# Patient Record
Sex: Female | Born: 1965 | Race: Black or African American | Hispanic: No | Marital: Single | Smoking: Never smoker
Health system: Southern US, Community
[De-identification: ages and names within clinical notes are randomized; demographics above are authoritative.]

## PROBLEM LIST (undated history)

## (undated) DIAGNOSIS — E559 Vitamin D deficiency, unspecified: Secondary | ICD-10-CM

## (undated) DIAGNOSIS — B351 Tinea unguium: Secondary | ICD-10-CM

## (undated) DIAGNOSIS — M1990 Unspecified osteoarthritis, unspecified site: Secondary | ICD-10-CM

## (undated) DIAGNOSIS — E785 Hyperlipidemia, unspecified: Secondary | ICD-10-CM

## (undated) DIAGNOSIS — E119 Type 2 diabetes mellitus without complications: Secondary | ICD-10-CM

## (undated) DIAGNOSIS — R002 Palpitations: Secondary | ICD-10-CM

## (undated) DIAGNOSIS — D219 Benign neoplasm of connective and other soft tissue, unspecified: Secondary | ICD-10-CM

## (undated) DIAGNOSIS — N189 Chronic kidney disease, unspecified: Secondary | ICD-10-CM

## (undated) DIAGNOSIS — M549 Dorsalgia, unspecified: Secondary | ICD-10-CM

## (undated) DIAGNOSIS — D649 Anemia, unspecified: Secondary | ICD-10-CM

## (undated) DIAGNOSIS — I10 Essential (primary) hypertension: Secondary | ICD-10-CM

## (undated) HISTORY — DX: Dorsalgia, unspecified: M54.9

## (undated) HISTORY — DX: Vitamin D deficiency, unspecified: E55.9

## (undated) HISTORY — DX: Hyperlipidemia, unspecified: E78.5

## (undated) HISTORY — DX: Type 2 diabetes mellitus without complications: E11.9

## (undated) HISTORY — DX: Unspecified osteoarthritis, unspecified site: M19.90

## (undated) HISTORY — PX: ABDOMINAL HYSTERECTOMY: SHX81

## (undated) HISTORY — DX: Palpitations: R00.2

## (undated) HISTORY — DX: Chronic kidney disease, unspecified: N18.9

## (undated) HISTORY — PX: SALPINGECTOMY: SHX328

## (undated) HISTORY — DX: Anemia, unspecified: D64.9

## (undated) HISTORY — DX: Tinea unguium: B35.1

## (undated) HISTORY — PX: ECTOPIC PREGNANCY SURGERY: SHX613

---

## 1998-10-03 ENCOUNTER — Other Ambulatory Visit: Admission: RE | Admit: 1998-10-03 | Discharge: 1998-10-03 | Payer: Self-pay | Admitting: Family Medicine

## 1998-10-25 ENCOUNTER — Emergency Department (HOSPITAL_COMMUNITY): Admission: EM | Admit: 1998-10-25 | Discharge: 1998-10-25 | Payer: Self-pay | Admitting: Emergency Medicine

## 1998-10-25 ENCOUNTER — Encounter: Payer: Self-pay | Admitting: Emergency Medicine

## 2001-07-22 ENCOUNTER — Encounter: Payer: Self-pay | Admitting: *Deleted

## 2001-07-22 ENCOUNTER — Encounter (INDEPENDENT_AMBULATORY_CARE_PROVIDER_SITE_OTHER): Payer: Self-pay

## 2001-07-22 ENCOUNTER — Inpatient Hospital Stay (HOSPITAL_COMMUNITY): Admission: AD | Admit: 2001-07-22 | Discharge: 2001-07-25 | Payer: Self-pay | Admitting: *Deleted

## 2001-08-16 ENCOUNTER — Other Ambulatory Visit: Admission: RE | Admit: 2001-08-16 | Discharge: 2001-08-16 | Payer: Self-pay | Admitting: Obstetrics and Gynecology

## 2004-02-24 ENCOUNTER — Emergency Department (HOSPITAL_COMMUNITY): Admission: EM | Admit: 2004-02-24 | Discharge: 2004-02-24 | Payer: Self-pay | Admitting: Emergency Medicine

## 2005-05-12 ENCOUNTER — Ambulatory Visit: Payer: Self-pay | Admitting: Family Medicine

## 2005-06-15 ENCOUNTER — Ambulatory Visit: Payer: Self-pay | Admitting: Family Medicine

## 2005-06-21 ENCOUNTER — Ambulatory Visit: Payer: Self-pay | Admitting: Family Medicine

## 2005-11-30 ENCOUNTER — Ambulatory Visit: Payer: Self-pay | Admitting: *Deleted

## 2005-11-30 ENCOUNTER — Ambulatory Visit: Payer: Self-pay | Admitting: Family Medicine

## 2005-11-30 ENCOUNTER — Encounter (INDEPENDENT_AMBULATORY_CARE_PROVIDER_SITE_OTHER): Payer: Self-pay | Admitting: Specialist

## 2005-11-30 ENCOUNTER — Inpatient Hospital Stay (HOSPITAL_COMMUNITY): Admission: AD | Admit: 2005-11-30 | Discharge: 2005-12-02 | Payer: Self-pay | Admitting: *Deleted

## 2005-12-06 ENCOUNTER — Ambulatory Visit: Payer: Self-pay | Admitting: Obstetrics & Gynecology

## 2006-02-18 ENCOUNTER — Emergency Department (HOSPITAL_COMMUNITY): Admission: EM | Admit: 2006-02-18 | Discharge: 2006-02-18 | Payer: Self-pay | Admitting: Emergency Medicine

## 2006-03-09 ENCOUNTER — Emergency Department (HOSPITAL_COMMUNITY): Admission: EM | Admit: 2006-03-09 | Discharge: 2006-03-09 | Payer: Self-pay | Admitting: Emergency Medicine

## 2006-05-19 ENCOUNTER — Ambulatory Visit: Payer: Self-pay | Admitting: Family Medicine

## 2006-06-01 ENCOUNTER — Emergency Department (HOSPITAL_COMMUNITY): Admission: EM | Admit: 2006-06-01 | Discharge: 2006-06-01 | Payer: Self-pay | Admitting: Family Medicine

## 2006-06-04 ENCOUNTER — Emergency Department (HOSPITAL_COMMUNITY): Admission: EM | Admit: 2006-06-04 | Discharge: 2006-06-04 | Payer: Self-pay | Admitting: Emergency Medicine

## 2006-06-16 ENCOUNTER — Emergency Department (HOSPITAL_COMMUNITY): Admission: EM | Admit: 2006-06-16 | Discharge: 2006-06-16 | Payer: Self-pay | Admitting: Emergency Medicine

## 2006-07-07 ENCOUNTER — Emergency Department (HOSPITAL_COMMUNITY): Admission: AD | Admit: 2006-07-07 | Discharge: 2006-07-07 | Payer: Self-pay | Admitting: Family Medicine

## 2006-08-06 ENCOUNTER — Emergency Department (HOSPITAL_COMMUNITY): Admission: EM | Admit: 2006-08-06 | Discharge: 2006-08-06 | Payer: Self-pay | Admitting: Emergency Medicine

## 2006-10-05 ENCOUNTER — Emergency Department (HOSPITAL_COMMUNITY): Admission: EM | Admit: 2006-10-05 | Discharge: 2006-10-05 | Payer: Self-pay | Admitting: Emergency Medicine

## 2006-11-19 ENCOUNTER — Emergency Department (HOSPITAL_COMMUNITY): Admission: EM | Admit: 2006-11-19 | Discharge: 2006-11-19 | Payer: Self-pay | Admitting: Family Medicine

## 2006-11-24 ENCOUNTER — Ambulatory Visit: Payer: Self-pay | Admitting: Family Medicine

## 2007-05-05 ENCOUNTER — Emergency Department (HOSPITAL_COMMUNITY): Admission: EM | Admit: 2007-05-05 | Discharge: 2007-05-05 | Payer: Self-pay | Admitting: Emergency Medicine

## 2007-10-19 ENCOUNTER — Ambulatory Visit (HOSPITAL_COMMUNITY): Admission: RE | Admit: 2007-10-19 | Discharge: 2007-10-19 | Payer: Self-pay | Admitting: Obstetrics and Gynecology

## 2007-10-19 ENCOUNTER — Encounter (HOSPITAL_COMMUNITY): Payer: Self-pay | Admitting: Obstetrics and Gynecology

## 2007-10-19 ENCOUNTER — Ambulatory Visit: Payer: Self-pay | Admitting: Obstetrics and Gynecology

## 2008-03-04 ENCOUNTER — Emergency Department (HOSPITAL_COMMUNITY): Admission: EM | Admit: 2008-03-04 | Discharge: 2008-03-04 | Payer: Self-pay | Admitting: Emergency Medicine

## 2009-03-19 ENCOUNTER — Emergency Department (HOSPITAL_COMMUNITY): Admission: EM | Admit: 2009-03-19 | Discharge: 2009-03-19 | Payer: Self-pay | Admitting: Emergency Medicine

## 2009-04-04 ENCOUNTER — Emergency Department (HOSPITAL_COMMUNITY): Admission: EM | Admit: 2009-04-04 | Discharge: 2009-04-04 | Payer: Self-pay | Admitting: Family Medicine

## 2009-04-14 ENCOUNTER — Ambulatory Visit: Payer: Self-pay | Admitting: Internal Medicine

## 2009-04-14 ENCOUNTER — Encounter: Payer: Self-pay | Admitting: Internal Medicine

## 2009-04-14 DIAGNOSIS — J209 Acute bronchitis, unspecified: Secondary | ICD-10-CM | POA: Insufficient documentation

## 2009-09-19 ENCOUNTER — Ambulatory Visit: Payer: Self-pay | Admitting: Internal Medicine

## 2009-09-22 ENCOUNTER — Ambulatory Visit: Payer: Self-pay | Admitting: Internal Medicine

## 2010-01-12 ENCOUNTER — Ambulatory Visit: Payer: Self-pay | Admitting: Internal Medicine

## 2010-01-12 ENCOUNTER — Encounter (INDEPENDENT_AMBULATORY_CARE_PROVIDER_SITE_OTHER): Payer: Self-pay | Admitting: Adult Health

## 2010-01-12 ENCOUNTER — Other Ambulatory Visit: Admission: RE | Admit: 2010-01-12 | Discharge: 2010-01-12 | Payer: Self-pay | Admitting: Family Medicine

## 2010-01-12 LAB — CONVERTED CEMR LAB
ALT: 17 units/L (ref 0–35)
Albumin: 4.1 g/dL (ref 3.5–5.2)
Basophils Relative: 0 % (ref 0–1)
CO2: 24 meq/L (ref 19–32)
Calcium: 9.2 mg/dL (ref 8.4–10.5)
Cholesterol: 164 mg/dL (ref 0–200)
Eosinophils Absolute: 0.3 10*3/uL (ref 0.0–0.7)
Eosinophils Relative: 4 % (ref 0–5)
HDL: 85 mg/dL (ref >39)
Lymphocytes Relative: 29 % (ref 12–46)
Lymphs Abs: 2 10*3/uL (ref 0.7–4.0)
MCV: 77.8 fL — ABNORMAL LOW (ref 78.0–100.0)
Monocytes Relative: 12 % (ref 3–12)
Neutro Abs: 3.7 10*3/uL (ref 1.7–7.7)
Total Protein: 7.7 g/dL (ref 6.0–8.3)
Triglycerides: 50 mg/dL (ref <150)
VLDL: 10 mg/dL (ref 0–40)
Vit D, 25-Hydroxy: 12 ng/mL — ABNORMAL LOW (ref 30–89)

## 2010-01-13 ENCOUNTER — Encounter (INDEPENDENT_AMBULATORY_CARE_PROVIDER_SITE_OTHER): Payer: Self-pay | Admitting: Adult Health

## 2010-02-04 ENCOUNTER — Ambulatory Visit (HOSPITAL_COMMUNITY): Admission: RE | Admit: 2010-02-04 | Discharge: 2010-02-04 | Payer: Self-pay | Admitting: Internal Medicine

## 2010-05-16 ENCOUNTER — Emergency Department (HOSPITAL_COMMUNITY): Admission: EM | Admit: 2010-05-16 | Discharge: 2010-05-16 | Payer: Self-pay | Admitting: Emergency Medicine

## 2010-07-15 ENCOUNTER — Ambulatory Visit: Payer: Self-pay | Admitting: Obstetrics & Gynecology

## 2010-07-30 ENCOUNTER — Other Ambulatory Visit: Admission: RE | Admit: 2010-07-30 | Discharge: 2010-07-30 | Payer: Self-pay | Admitting: Obstetrics & Gynecology

## 2010-07-30 ENCOUNTER — Ambulatory Visit: Payer: Self-pay | Admitting: Obstetrics and Gynecology

## 2010-08-11 ENCOUNTER — Ambulatory Visit (HOSPITAL_COMMUNITY): Admission: RE | Admit: 2010-08-11 | Discharge: 2010-08-11 | Payer: Self-pay | Admitting: Obstetrics & Gynecology

## 2010-08-11 ENCOUNTER — Ambulatory Visit: Payer: Self-pay | Admitting: Obstetrics & Gynecology

## 2010-08-12 HISTORY — PX: UTERINE FIBROID SURGERY: SHX826

## 2010-08-13 ENCOUNTER — Ambulatory Visit: Payer: Self-pay | Admitting: Family Medicine

## 2011-03-11 LAB — CBC
MCHC: 30.7 g/dL (ref 30.0–36.0)
Platelets: 349 10*3/uL (ref 150–400)
RDW: 17.9 % — ABNORMAL HIGH (ref 11.5–15.5)

## 2011-03-11 LAB — PREGNANCY, URINE: Preg Test, Ur: NEGATIVE

## 2011-03-12 LAB — POCT PREGNANCY, URINE: Preg Test, Ur: NEGATIVE

## 2011-03-15 LAB — DIFFERENTIAL
Basophils Relative: 0 % (ref 0–1)
Eosinophils Relative: 3 % (ref 0–5)
Lymphocytes Relative: 22 % (ref 12–46)
Neutro Abs: 3.9 10*3/uL (ref 1.7–7.7)

## 2011-03-15 LAB — CBC
MCHC: 32.1 g/dL (ref 30.0–36.0)
Platelets: 282 10*3/uL (ref 150–400)
RDW: 14.4 % (ref 11.5–15.5)

## 2011-03-15 LAB — BASIC METABOLIC PANEL
BUN: 10 mg/dL (ref 6–23)
Calcium: 9.3 mg/dL (ref 8.4–10.5)
Chloride: 102 mEq/L (ref 96–112)
Potassium: 4.4 mEq/L (ref 3.5–5.1)

## 2011-03-15 LAB — POCT CARDIAC MARKERS: Troponin i, poc: <0.05 ng/mL (ref 0.00–0.09)

## 2011-03-15 LAB — TROPONIN I: Troponin I: <0.01 ng/mL (ref 0.00–0.06)

## 2011-03-15 LAB — CK TOTAL AND CKMB (NOT AT ARMC): CK, MB: 1 ng/mL (ref 0.3–4.0)

## 2011-03-15 LAB — D-DIMER, QUANTITATIVE: D-Dimer, Quant: 0.56 ug/mL-FEU — ABNORMAL HIGH (ref 0.00–0.48)

## 2011-05-11 NOTE — Group Therapy Note (Signed)
NAME:  Christina Stanley, Christina Stanley NO.:  [id number]   MEDICAL RECORD NO.:  [id number]          PATIENT TYPE:  WOC   LOCATION:  [facility]                   FACILITY:  [facility]   PHYSICIAN:  Argentina Donovan, MD        DATE OF BIRTH:  1966/10/08   DATE OF SERVICE:                                  CLINIC NOTE   REASON FOR VISIT TODAY:  [name] is in for a Pap and physical.  She  had her mammogram done today and results of that are pending.   REVIEW OF SYSTEMS:  She denies any problems.  Regular bowel movements.  No abdominal pain.  No headaches, dizziness, or blurred vision.   MEDICAL HISTORY:  Negative.   SURGICAL HISTORY:  Positive for ectopic x2.   FAMILY HISTORY:  Positive for diabetes, high blood pressure, and cancer.   PHYSICAL EXAM:  HEAD:  Normocephalic.  EYES:  PERRL.  THROAT:  Thyroid nonpalpable.  BREASTS:  Breasts symmetric without dimpling or lesions.  No palpable  masses noted with exam.  Patient states she does breast exam routinely  every 1 to 2 months.  ABDOMEN:  Soft and nontender.  No splenomegaly noted.  Bladder is  nontender.   Sterile speculum exam was performed.  Pap smear obtained.  Also, GC and  chlamydia were obtained for screening.  Bimanual exam, uterus normal in  size, shape, and contour.  No tenderness noted.  Adnexal exam was within  normal limits.  No palpable masses.  Patient has no pain with exam.  Cervix is smooth, pink, and bulbous.  There is no abnormal vaginal  discharge.   ASSESSMENT:  Normal GYN exam on a 45 year old patient without  complications.  Contraceptive not needed due to bilateral ectopics with  salpingectomies.     ______________________________  Zerita Boers, CNM    ______________________________  Argentina Donovan, MD    DL/MEDQ  D:  [id number]/[id number]  T:  10/20/2007  Job:  [id number]

## 2011-05-14 NOTE — Op Note (Signed)
[facility]  Patient:    Christina Stanley, Christina Stanley                           MRN: [id number] Proc. Date: 07/22/01 Attending:  Alvino Chapel, M.D.                           Operative Report  PREOPERATIVE DIAGNOSES:       1. Ruptured ectopic pregnancy.                               2. Unstable patient with hypotension.  POSTOPERATIVE DIAGNOSES:      1. Ruptured ectopic pregnancy.                               2. Unstable patient with hypotension.  PROCEDURE:                    Exploratory laparotomy with left salpingectomy and removal of ectopic pregnancy.  SURGEON:                      Alvino Chapel, M.D.  ANESTHESIA:                   General.  ESTIMATED BLOOD LOSS:         2000 cc hemoperitoneum.  URINE OUTPUT:                 Approximately 100 cc.  IV FLUIDS:                    5600 cc LR.  Transfused 3 units of packed red blood cells.  FINDINGS:                     The patient had a fetus identified at the time of surgery, which was approximately 9-10 weeks in size.  It appeared to have come from the left fallopian tube, which had a large rent proximally approximately 3 cm in length.  This was actively bleeding.  It was therefore removed.  The right and left ovaries were within normal limits.  The right fallopian tube was essentially within normal limits except for some adhesions to the uterus itself.  DESCRIPTION OF PROCEDURE:     The patient was taken to the operating room, where general anesthesia was obtained without difficulty.  She was then prepped and draped in the normal sterile fashion in the dorsal supine position with a Foley catheter in place.  A Pfannenstiel skin incision was then made and carried through to the underlying layer of fascia by sharp dissection. The fascia was then nicked in the midline and the incision was extended laterally with Mayo scissors.  Kocher clamps were then utilized to elevate the lower fascia.   This was dissected off the underlying rectus muscles.  In a similar fashion, the superior aspect was dissected off the rectus muscles. The rectus muscles were then separated in the midline.  The peritoneum was identified and opened bluntly with a large amount of bright red blood obtained at entry.  This blood was removed quickly, with multiple clots and active bleeding noted.  The uterus was quickly localized and the bowel packed away with moist laparotomy sponges.  The left cornua was  grasped with a curved Kelly clamp and elevated to the level of the incision.  In removing clot from around this area, a free-floating fetus was identified and removed from the abdomen and taken to pathology.  The left fallopian tube was then carefully examined and a large, 3 cm rent noted that was actively bleeding.  This was in the proximal portion of the tube, although it was not cornual.  Given the large size of the defect and the active bleeding, the decision was made to proceed with salpingectomy.  Therefore, the tube was cross clamped with curved Kelly clamps and suture ligated with 0 Vicryl in two bights.  With the tube thus removed, one small area of bleeding between the two pedicles was controlled with 3-0 Vicryl in a running suture.  The remainder of the abdomen was inspected and, again, multiple clots were removed.  It was irrigated copiously multiple times with as much of the remaining hemoperitoneum evacuated as possible.  The right adnexa was carefully inspected.  It was found to be normal except for adhesions of the right fallopian tube to the uterus.  The fimbriated end appeared normal.  Therefore, all laparotomy sponges and instruments were removed from the patients abdomen and counted. The patients pedicle was once again inspected on the left and found to be hemostatic.  The muscles were then reapproximated with one 2-0 Vicryl interrupted suture.  The fascia was closed with 0 Vicryl in a  running fashion. The skin was closed with staples.  Sponge, lap, and needle counts were correct x 2 and the patient was taken to the recovery room in stable condition. DD:  07/22/01 TD:  07/23/01 Job: [id number]

## 2011-05-14 NOTE — Op Note (Signed)
NAME:  Christina Stanley, Christina Stanley                  ACCOUNT NO.:  [id number]   MEDICAL RECORD NO.:  [id number]          PATIENT TYPE:  AMB   LOCATION:  [facility]                           [facility]   PHYSICIAN:  Conni Elliot, M.D.DATE OF BIRTH:  03/24/1966   DATE OF PROCEDURE:  11/30/2005  DATE OF DISCHARGE:                                 OPERATIVE REPORT   PREOPERATIVE DIAGNOSIS:  Probable ectopic pregnancy.   POSTOPERATIVE DIAGNOSIS:  Probable ectopic pregnancy.   OPERATION/PROCEDURE:  1.  Exploratory laparotomy.  2.  Partial right salpingectomy.   SURGEON:  Conni Elliot, M.D.   ANESTHESIA:  General endotracheal anesthesia.   OPERATIVE FINDINGS:  Upon entering the peritoneal cavity, there was  hemoperitoneum.  The right fallopian tube was distended all the way down to  its fimbriated end and well into the mesosalpinx.  There was active bleeding  in the mesosalpinx.   DESCRIPTION OF PROCEDURE:  The patient was placed under general endotracheal  anesthesia, the patient supine, was prepped and draped in the usual fashion.  A Pfannenstiel skin incision was made in the skin, through the fascia and  peritoneal cavity entered.  The self-retaining retractor was placed.  The  omentum and bowel were packed cephalad.  The right fallopian tube was  identified, grasped with Babcock clamp and brought into the operative field.  The specimen was excised by serial Kelly clamps and then suture ligatures.  At the end of the removal of the end of the right fallopian tube, hemostasis  was adequate.  An additional hemostatic ligature was placed around the  surgical stumps to assure hemostasis.  Hemostasis continued to be complete.  Anterior peritoneum, fascia and skin closed in routine fashion.  Estimated  blood loss was less than 100 mL without replacement.  Needle and sponge  counts correct.           ______________________________  Conni Elliot, M.D.     ASG/MEDQ  D:  11/30/2005  T:   11/30/2005  Job:  [id number]

## 2011-05-14 NOTE — Discharge Summary (Signed)
[facility]  Patient:    Christina Stanley, Christina Stanley                         MRN: [id number] Adm. Date:  [id number] Disc. Date: 07/25/01 Attending:  Michaelle Copas                           Discharge Summary  DISCHARGE DIAGNOSES:          1. Status post ruptured ectopic pregnancy                                  presenting unstable.                               2. Status post emergency exploratory laparotomy                                  with left salpingectomy, evacuation of                                  hemoperitoneum, and removal of ectopic                                  pregnancy.                               3. Status post transfusion 3 units packed red                                  blood cells.  DISCHARGE MEDICATIONS:        1. Percocet one to two tablets p.o. q.4h. p.r.n.                               2. Motrin 600 mg p.o. q.6h.                               3. Iron sulfate 325 mg one p.o. q.d.  DISCHARGE FOLLOW-UP:          Patient is to follow-up in two weeks for an incision check.  HOSPITAL COURSE:              This patient is a 45 year old G2, P1 African-American female who arrived to [facility] by EMS with acute abdominal pain and hypotension.  Per the patient she was pregnant at approximately [redacted] weeks gestation by her LMP and had an acute onset of sharp abdominal pain shortly before arrival and passed out at home.  Upon arrival the patients resuscitation was begun with IV fluids and she was given O- blood x 1 unit for her acute hypotension.  STAT hemoglobin was 7.3.  Blood pressure on arrival was 67/18 and improved to 106/60s with IV hydration and blood.  With the patient thus stabilized, her situation was discussed with her family and herself and decision was made for  immediate exploratory laparotomy.  PAST MEDICAL HISTORY:         Significant for PIH with her last pregnancy.  PAST SURGICAL HISTORY:        None.  PAST OBSTETRICAL  HISTORY:     One normal spontaneous vaginal delivery.  PAST GYNECOLOGICAL HISTORY:   None.  ALLERGIES:                    None.  MEDICATIONS:                  None.  PHYSICAL EXAMINATION  ABDOMEN:                      On admission the patients abdomen was acute with guarding and rebound and positive distention consistent with a hemoperitoneum.  A quick ultrasound in triage revealed uterus empty with a probable left ruptured ectopic pregnancy.  Patient then underwent urgent laparotomy with left salpingectomy, evacuation hemoperitoneum, and removal of ectopic pregnancy.  Estimated blood loss was at least 2 L.  Patient received a total of 3 units of packed red blood cells.  One of these was O- and unmatched.  At the time of surgery the patient had a fetus identified which was approximately 9 weeks in size and was free floating from a left fallopian tube which had a large rent and was actively bleeding.  Given the large defect in the left fallopian tube, the decision was made to proceed with salpingectomy.  The right fallopian tube had some adhesions, but overall appeared normal with no hydrosalpinx noted.  Ovaries were both normal. Intraoperative hemoglobin was 6.7 and the patient had received 2 units of packed red blood cells after that laboratory.  She remained stable throughout the procedure and in the recovery room.  Therefore, any further transfusion was held.  On postoperative day #1 her hemoglobin went from 10.5 post transfusion to 8.7.  Abdomen was slightly distended and she had no return of bowel function.  Her hemoglobin on postoperative day #2 dropped to 7.4; however, she had begun to feel better and having a slow return of bowel function.  By postoperative day #3 she was afebrile with stable vital signs. Her hemoglobin went from 7.4 to 8.0 and was stabilized.  She had some mild dizziness with ambulation early in the morning, however, overall felt well. The incision was well  approximated.  Steri-Strips were applied.  There was no erythema noted.  Therefore, Ms. Reller was felt stable for discharge and was discharged to home with followup in two weeks in our office. DD:  07/25/01 TD:  07/25/01 Job: [id number]

## 2011-05-14 NOTE — Discharge Summary (Signed)
NAMENEIL, Christina Stanley                  ACCOUNT NO.:  [id number]   MEDICAL RECORD NO.:  [id number]          PATIENT TYPE:  INP   LOCATION:  9305                          [facility]ILITY:  [facility]   PHYSICIAN:  Christina Stanley, M.D.DATE OF BIRTH:  1966/08/04   DATE OF ADMISSION:  11/30/2005  DATE OF DISCHARGE:  12/02/2005                                 DISCHARGE SUMMARY   ADMITTING DIAGNOSES:  Probable ectopic.   DISCHARGE DIAGNOSES:  1.  Status post right partial salpingectomy for ectopic pregnancy.  2.  Urinary tract infection.   LABORATORIES:  Admit hemoglobin was 10.7, postoperative hemoglobin was 8.9.  Blood type O+.  Beta hCG 4229.   IMAGING:  Ultrasound showed no intrauterine pregnancy, 6 cm right adnexal  mass, possible gestational sac, and embryo seen.   HOSPITAL COURSE:  Patient is a 45 year old G3, P1-0-1-1 with an LMP of  approximately October 25.  She presented with intermittent right lower  quadrant pain for four to five days.  She had a significant history of  having a ruptured ectopic that required an exploratory lap and left  salpingectomy in 2002.  Urine pregnancy test was positive.  Other  laboratories are as above.  Imaging was consistent with an ectopic  pregnancy.  Patient was taken to the OR by Dr. Gavin Potters.  Please see op note  for full details.  He did do a right salpingectomy and reported that there  seemed to be some bleeding into her mesosalpinx.  Patient did well  postoperative and was stable for discharge on postoperative day two.   DISPOSITION:  Home.   FOLLOW-UP:  December 06, 2005 at [facility] for staple removal and for a  quantitative beta hCG level and then follow up December 28 at 3:30 in the  gynecology clinic.   DISCHARGE INSTRUCTIONS:  No heavy lifting.  Nothing per vagina.  Call with  questions or concerns.  Diet regular.   MEDICATIONS:  1.  Percocet 5 one to two p.o. q.4-6h. p.r.n. pain.  2.  Ferrous sulfate 325 one tablet p.o. daily.  3.  Colace 100  mg one tablet p.o. b.i.d.  4.  Multivitamin daily.     ______________________________  Christina Stanley, M.D.    ______________________________  Christina Stanley, M.D.    TLW/MEDQ  D:  12/02/2005  T:  12/02/2005  Job:  [id number]

## 2011-08-10 DIAGNOSIS — N920 Excessive and frequent menstruation with regular cycle: Secondary | ICD-10-CM

## 2011-08-10 DIAGNOSIS — D259 Leiomyoma of uterus, unspecified: Secondary | ICD-10-CM | POA: Insufficient documentation

## 2011-08-18 ENCOUNTER — Ambulatory Visit: Payer: Self-pay | Admitting: Obstetrics & Gynecology

## 2012-07-11 ENCOUNTER — Other Ambulatory Visit (HOSPITAL_COMMUNITY): Payer: Self-pay | Admitting: Internal Medicine

## 2012-07-11 DIAGNOSIS — Z1231 Encounter for screening mammogram for malignant neoplasm of breast: Secondary | ICD-10-CM

## 2012-07-31 ENCOUNTER — Ambulatory Visit (HOSPITAL_COMMUNITY): Payer: Self-pay

## 2012-08-23 ENCOUNTER — Ambulatory Visit: Payer: Self-pay | Admitting: Obstetrics & Gynecology

## 2012-09-14 ENCOUNTER — Encounter: Payer: Self-pay | Admitting: Obstetrics and Gynecology

## 2012-09-14 ENCOUNTER — Other Ambulatory Visit (HOSPITAL_COMMUNITY)
Admission: RE | Admit: 2012-09-14 | Discharge: 2012-09-14 | Disposition: A | Discharge status: Home / Self Care | Payer: Medicaid Other | Source: Ambulatory Visit | Attending: Obstetrics and Gynecology | Admitting: Obstetrics and Gynecology

## 2012-09-14 ENCOUNTER — Ambulatory Visit (INDEPENDENT_AMBULATORY_CARE_PROVIDER_SITE_OTHER): Payer: Medicaid Other | Admitting: Obstetrics and Gynecology

## 2012-09-14 VITALS — BP 138/94 | HR 73 | Temp 98.2°F | Ht 67.0 in | Wt 263.3 lb

## 2012-09-14 DIAGNOSIS — N390 Urinary tract infection, site not specified: Secondary | ICD-10-CM

## 2012-09-14 DIAGNOSIS — Z01419 Encounter for gynecological examination (general) (routine) without abnormal findings: Secondary | ICD-10-CM | POA: Insufficient documentation

## 2012-09-14 DIAGNOSIS — Z113 Encounter for screening for infections with a predominantly sexual mode of transmission: Secondary | ICD-10-CM | POA: Insufficient documentation

## 2012-09-14 DIAGNOSIS — E669 Obesity, unspecified: Secondary | ICD-10-CM | POA: Insufficient documentation

## 2012-09-14 DIAGNOSIS — Z1231 Encounter for screening mammogram for malignant neoplasm of breast: Secondary | ICD-10-CM

## 2012-09-14 LAB — POCT URINALYSIS DIP (DEVICE)
Bilirubin Urine: NEGATIVE
Hgb urine dipstick: NEGATIVE
Nitrite: POSITIVE — AB
Urobilinogen, UA: 0.2 mg/dL (ref 0.0–1.0)
pH: 6 (ref 5.0–8.0)

## 2012-09-14 MED ORDER — CIPROFLOXACIN HCL 500 MG PO TABS: 500.0000 mg | ORAL_TABLET | Freq: Two times a day (BID) | ORAL | Status: DC

## 2012-09-14 NOTE — Patient Instructions (Signed)
Calorie Counting Diet A calorie counting diet requires you to eat the number of calories that are right for you in a day. Calories are the measurement of how much energy you get from the food you eat. Eating the right amount of calories is important for staying at a healthy weight. If you eat too many calories, your body will store them as fat and you may gain weight. If you eat too few calories, you may lose weight. Counting the number of calories you eat during a day will help you know if you are eating the right amount. A Registered Dietitian can determine how many calories you need in a day. The amount of calories needed varies from person to person. If your goal is to lose weight, you will need to eat fewer calories. Losing weight can benefit you if you are overweight or have health problems such as heart disease, high blood pressure, or diabetes. If your goal is to gain weight, you will need to eat more calories. Gaining weight may be necessary if you have a certain health problem that causes your body to need more energy. TIPS Whether you are increasing or decreasing the number of calories you eat during a day, it may be hard to get used to changes in what you eat and drink. The following are tips to help you keep track of the number of calories you eat.  Measure foods at home with measuring cups. This helps you know the amount of food and number of calories you are eating.   Restaurants often serve food in amounts that are larger than 1 serving. While eating out, estimate how many servings of a food you are given. For example, a serving of cooked rice is  cup or about the size of half of a fist. Knowing serving sizes will help you be aware of how much food you are eating at restaurants.   Ask for smaller portion sizes or child-size portions at restaurants.   Plan to eat half of a meal at a restaurant. Take the rest home or share the other half with a friend.   Read the Nutrition Facts panel on  food labels for calorie content and serving size. You can find out how many servings are in a package, the size of a serving, and the number of calories each serving has.   For example, a package might contain 3 cookies. The Nutrition Facts panel on that package says that 1 serving is 1 cookie. Below that, it will say there are 3 servings in the container. The calories section of the Nutrition Facts label says there are 90 calories. This means there are 90 calories in 1 cookie (1 serving). If you eat 1 cookie you have eaten 90 calories. If you eat all 3 cookies, you have eaten 270 calories (3 servings x 90 calories = 270 calories).  The list below tells you how big or small some common portion sizes are.  1 oz.........4 stacked dice.   3 oz.........Deck of cards.   1 tsp........Tip of little finger.   1 tbs........Thumb.   2 tbs........Golf ball.    cup.......Half of a fist.   1 cup........A fist.  KEEP A FOOD LOG Write down every food item you eat, the amount you eat, and the number of calories in each food you eat during the day. At the end of the day, you can add up the total number of calories you have eaten. It may help to keep a   list like the one below. Find out the calorie information by reading the Nutrition Facts panel on food labels. Breakfast  Bran cereal (1 cup, 110 calories).   Fat-free milk ( cup, 45 calories).  Snack  Apple (1 medium, 80 calories).  Lunch  Spinach (1 cup, 20 calories).   Tomato ( medium, 20 calories).   Chicken breast strips (3 oz, 165 calories).   Shredded cheddar cheese ( cup, 110 calories).   Light [place] dressing (2 tbs, 60 calories).   Whole-wheat bread (1 slice, 80 calories).   Tub margarine (1 tsp, 35 calories).   Vegetable soup (1 cup, 160 calories).  Dinner  Pork chop (3 oz, 190 calories).   Brown rice (1 cup, 215 calories).   Steamed broccoli ( cup, 20 calories).   Strawberries (1  cup, 65 calories).   Whipped  cream (1 tbs, 50 calories).  Daily Calorie Total: 1425 Document Released: 12/13/2005 Document Revised: 12/02/2011 Document Reviewed: 06/09/2007 [facility] Patient Information 2012 [place], [place].Mammogram Tips Healthy women should begin getting mammograms every year or two once they reach age 24, and once a year when they reach age 13. Here are tips:  Find an experienced, high-volume center with accomplished radiologists. You can ask for their credentials.   Ask to see the certificate showing the center is approved by the U.S. Food and Drug Administration.   Use the same center regularly, so it is easier to compare your new mammograms with your old ones.   Bring a list of places you have had mammograms, dates, biopsies or other breast treatments. Bring old mammograms with you or have them sent to your primary caregiver.   Describe any breast problems to your caregiver or the person doing the mammogram. Be ready to give past surgeries, birth control pills, hormone use, breast implants, growths, moles, breast scars and family or personal history of breast cancer.   Call your doctor or center to check on the mammogram if you hear nothing within 10 days. Do not assume everything was normal.   To protect your privacy, the mammogram results cannot be given over the phone or to anyone but you.   Radiation from a mammogram is very low and does not pose a radiation risk.   Mammograms can detect breast problems other than breast cancer.   You may be asked stand or sit in front of the X-ray machine.   Two small plastic or glass plates are placed around the breast when taking the X-ray.   If you are menstruating, schedule your mammogram a week after your menstrual period.   Do not wear deodorants, powder or perfume when getting a mammogram.   Wash your breasts and under your arms before getting a mammogram.   Wear cloths that are easy for you to undress and dress.   Arrive at the center at  least 15 minutes before the mammogram is scheduled.   There may be slight discomfort during the mammogram, but it goes away shortly after the test.   Try to relax as much as possible during the mammogram.   Talk to your caregiver if you do not understand the results of the mammogram.   Follow the recommendations of your caregiver regarding further tests and treatments if needed.   Get a second opinion if you are concerned or question the results of the mammogram, further tests or treatment if needed.   Continue with monthly self-breast exams and yearly caregiver exams even if the mammogram is normal.  Your caregiver may recommend getting a mammogram before age 89 and more often if you are at high risk for developing breast cancer.  Document Released: 03/31/2006 Document Revised: 12/02/2011 Document Reviewed: 12/06/2008 [facility] Patient Information 2012 [place], [place].

## 2012-09-14 NOTE — Progress Notes (Signed)
Subjective:     Christina Stanley is a 46 y.o. female G1P1 and is here for GYN physical exam. The patient reports dysuria and suprapubic abdominal pain during urination. She denies hematuria, urgency of urination but does have frequency. she states she had an abnormal Pap smear at some time in the past and that her last one was 2-3 years ago. She had a negative mammogram in 2011. She missed an appointment for a mammogram this year. She is concerned a few months of intermittent pain above right breast. Denies irritative vaginal discharge. Was receiving primary care at [facility] however that has closed down.  FH: negative for breast, ovarian,  colon endometrial cancer. OB HX: G3 P1021 with NSVD 16 years ago complicated by pregnancy hypertension. She states she has no tubes due to 2 ectopic pregnancies with bilateral salpingectomies. GYN HX: in 2011 she went underwent diagnostic hysteroscopy, endometrial curettage and removal of cervical canal fibroid. This controlled her menorrhagia and she is having normal menses now.   History   Social History  . Marital Status: Single    Spouse Name: N/A    Number of Children: N/A  . Years of Education: N/A   Occupational History  . Not on file.   Social History Main Topics  . Smoking status: Never Smoker   . Smokeless tobacco: Not on file  . Alcohol Use: Yes  . Drug Use: No  . Sexually Active: Yes    Birth Control/ Protection: None   Other Topics Concern  . Not on file   Social History Narrative  . No narrative on file   Health Maintenance  Topic Date Due  . Pap Smear  12/02/1984  . Tetanus/tdap  12/02/1985  . Influenza Vaccine  08/27/2012    The following portions of the patient's history were reviewed and updated as appropriate: allergies, current medications, past family history, past medical history, past social history, past surgical history and problem list.  Review of Systems Pertinent items are noted in HPI.   Objective:    BP  138/94  Pulse 73  Temp 98.2 F (36.8 C) (Oral)  Ht 5\' 7"  (1.702 m)  Wt 263 lb 4.8 oz (119.432 kg)  BMI 41.24 kg/m2  LMP 08/25/2012 General appearance: alert, cooperative, no distress and morbidly obese Neck: no adenopathy, supple, symmetrical, trachea midline and thyroid not enlarged, symmetric, no tenderness/mass/nodules Lungs: clear to auscultation bilaterally Breasts: normal appearance, no masses or tenderness, Inspection negative, No nipple retraction or dimpling, No nipple discharge or bleeding, No axillary or supraclavicular adenopathy, Normal to palpation without dominant masses, Sl tenderness of musculature  superior to right breast. Heart: regular rate and rhythm, S1, S2 normal, no murmur, click, rub or gallop Abdomen: obese, soft, NT Pelvic: cervix normal in appearance, external genitalia normal, no adnexal masses or tenderness, no cervical motion tenderness, uterus normal size, shape, and consistency and vagina normal without discharge    Results for orders placed in visit on 09/14/12 (from the past 24 hour(s))  POCT URINALYSIS DIP (DEVICE)     Status: Abnormal   Collection Time   09/14/12  1:01 PM      Component Value Range   Glucose, UA NEGATIVE  NEGATIVE mg/dL   Bilirubin Urine NEGATIVE  NEGATIVE   Ketones, ur NEGATIVE  NEGATIVE mg/dL   Specific Gravity, Urine 1.020  1.005 - 1.030   Hgb urine dipstick NEGATIVE  NEGATIVE   pH 6.0  5.0 - 8.0   Protein, ur NEGATIVE  NEGATIVE mg/dL  Urobilinogen, UA 0.2  0.0 - 1.0 mg/dL   Nitrite POSITIVE (*) NEGATIVE   Leukocytes, UA NEGATIVE  NEGATIVE   Assessment:    Healthy female exam. Obesity UTI     Plan:    Rx Cipro 500 mg bid x 3 d and increase fluids Pap, GC/CT sent Mammogram scheduled Discussed healthy diet and exercise See After Visit Summary for Counseling Recommendations

## 2012-09-29 ENCOUNTER — Ambulatory Visit (HOSPITAL_COMMUNITY): Admission: RE | Admit: 2012-09-29 | Payer: Medicaid Other | Source: Ambulatory Visit

## 2012-10-06 ENCOUNTER — Telehealth: Payer: Self-pay | Admitting: *Deleted

## 2012-10-06 NOTE — Telephone Encounter (Signed)
Pt left message requesting results of Pap done 3 weeks ago.  I returned her call and left message that her Pap was normal and she will be receiving a letter in the mail. She can call back if she has further questions.

## 2013-11-01 ENCOUNTER — Other Ambulatory Visit: Payer: Self-pay

## 2014-04-03 ENCOUNTER — Emergency Department (HOSPITAL_COMMUNITY)
Admission: EM | Admit: 2014-04-03 | Discharge: 2014-04-03 | Disposition: A | Discharge status: Home / Self Care | Payer: Medicaid Other

## 2014-04-03 NOTE — ED Notes (Signed)
No response x3 

## 2014-04-03 NOTE — ED Notes (Signed)
No response x2 ?

## 2014-04-03 NOTE — ED Notes (Signed)
No answer

## 2014-04-03 NOTE — ED Notes (Signed)
Called for patient with no answer

## 2014-10-28 ENCOUNTER — Encounter: Payer: Self-pay | Admitting: Obstetrics and Gynecology

## 2016-07-28 ENCOUNTER — Other Ambulatory Visit: Payer: Self-pay | Admitting: Obstetrics and Gynecology

## 2016-07-28 DIAGNOSIS — Z1231 Encounter for screening mammogram for malignant neoplasm of breast: Secondary | ICD-10-CM

## 2016-08-04 ENCOUNTER — Telehealth (HOSPITAL_COMMUNITY): Payer: Self-pay | Admitting: *Deleted

## 2016-08-04 NOTE — Telephone Encounter (Signed)
Telephoned patient at home # and left message on voicemail concerning [facility] appointment Thursday August 10 2:00

## 2016-08-05 ENCOUNTER — Ambulatory Visit (HOSPITAL_COMMUNITY): Payer: Medicaid Other

## 2017-03-13 ENCOUNTER — Ambulatory Visit (HOSPITAL_COMMUNITY)
Admission: EM | Admit: 2017-03-13 | Discharge: 2017-03-13 | Disposition: A | Discharge status: Home / Self Care | Payer: Medicaid Other | Attending: Family Medicine | Admitting: Family Medicine

## 2017-03-13 ENCOUNTER — Encounter (HOSPITAL_COMMUNITY): Payer: Self-pay | Admitting: *Deleted

## 2017-03-13 DIAGNOSIS — E882 Lipomatosis, not elsewhere classified: Secondary | ICD-10-CM

## 2017-03-13 HISTORY — DX: Benign neoplasm of connective and other soft tissue, unspecified: D21.9

## 2017-03-13 LAB — POCT URINALYSIS DIP (DEVICE)
Glucose, UA: 100 mg/dL — AB
HGB URINE DIPSTICK: NEGATIVE
Leukocytes, UA: NEGATIVE
Nitrite: NEGATIVE
PH: 6 (ref 5.0–8.0)
PROTEIN: 100 mg/dL — AB
SPECIFIC GRAVITY, URINE: 1.025 (ref 1.005–1.030)
Urobilinogen, UA: 1 mg/dL (ref 0.0–1.0)

## 2017-03-13 LAB — POCT PREGNANCY, URINE: Preg Test, Ur: NEGATIVE

## 2017-03-13 MED ORDER — MELOXICAM 7.5 MG PO TABS: 7.5000 mg | ORAL_TABLET | Freq: Every day | ORAL | 0 refills | Status: DC

## 2017-03-13 NOTE — Discharge Instructions (Signed)
These knots that you are feeling in your abdomen are most consistent with lipomas, collection of fatty material that many people get. When they get inflamed, they become somewhat tender.  I've ordered some anti-inflammatory medicine that should help with this.

## 2017-03-13 NOTE — ED Triage Notes (Signed)
C/O intermittent "knots" that occur in various areas of abdomen x approx 2 wks with gradual worsening.  No c/o swelling near right clavicle.  Had temp 101 yesterday; has resolved with Tyl.  Denies n/v.  Last BM yesterday.

## 2017-03-13 NOTE — ED Provider Notes (Signed)
[facility]    CSN: [id number] Arrival date & time: 03/13/17  1434     History   Chief Complaint Chief Complaint  Patient presents with  . Abdominal Pain  . Edema    HPI Christina Stanley is a 51 y.o. female.   C/O intermittent "knots" that occur in various areas of abdomen x approx 2 wks with gradual worsening.  No c/o swelling near right clavicle.  Had temp 101 yesterday; has resolved with Tyl.  Denies n/v.  Last BM yesterday.      Past Medical History:  Diagnosis Date  . Fibroids     Patient Active Problem List   Diagnosis Date Noted  . Obesity 09/14/2012  . Leiomyoma of uterus, unspecified 08/10/2011  . Excessive or frequent menstruation 08/10/2011  . ACUTE BRONCHITIS 04/14/2009    Past Surgical History:  Procedure Laterality Date  . ECTOPIC PREGNANCY SURGERY     two pregnancy  . SALPINGECTOMY    . UTERINE FIBROID SURGERY      OB History    Gravida Para Term Preterm AB Living   3 1 1  0 2 1   SAB TAB Ectopic Multiple Live Births   0 0 2           Home Medications    Prior to Admission medications   Medication Sig Start Date End Date Taking? Authorizing Provider  meloxicam (MOBIC) 7.5 MG tablet Take 1 tablet (7.5 mg total) by mouth daily. 03/13/17   Robyn Haber, MD    Family History Family History  Problem Relation Age of Onset  . Diabetes Mother   . Hypertension Mother   . Cancer Father     colon  . Diabetes Father   . Hypertension Father   . Diabetes Sister   . Hypertension Sister   . Cancer Maternal Grandmother     Social History Social History  Substance Use Topics  . Smoking status: Never Smoker  . Smokeless tobacco: Not on file  . Alcohol use No     Allergies   Amoxicillin   Review of Systems Review of Systems   Physical Exam Triage Vital Signs ED Triage Vitals  Enc Vitals Group     BP 03/13/17 1527 (!) 138/92     Pulse Rate 03/13/17 1527 75     Resp 03/13/17 1527 16     Temp 03/13/17 1527 98.5 F  (36.9 C)     Temp Source 03/13/17 1527 Oral     SpO2 03/13/17 1527 100 %     Weight --      Height --      Head Circumference --      Peak Flow --      Pain Score 03/13/17 1529 8     Pain Loc --      Pain Edu? --      Excl. in [place]? --    No data found.   Updated Vital Signs BP (!) 138/92   Pulse 75   Temp 98.5 F (36.9 C) (Oral)   Resp 16   LMP 02/23/2017 (Approximate)   SpO2 100%   Physical Exam  Constitutional: She is oriented to person, place, and time. She appears well-developed and well-nourished.  HENT:  Right Ear: External ear normal.  Left Ear: External ear normal.  Eyes: Conjunctivae are normal. Pupils are equal, round, and reactive to light.  Neck: Normal range of motion. Neck supple.  Cardiovascular: Normal rate, regular rhythm and normal heart sounds.  Pulmonary/Chest: Effort normal and breath sounds normal.  Patient has a soft fleshy subcutaneous area in the medial third of her right clavicle which is nontender and most consistent with a lipoma.  Abdominal: Soft. Bowel sounds are normal. She exhibits no mass.  Patient has about 5 different subcutaneous masses that range in size from 1-3 cm and are nontender. His are consistent with lipomas.  Musculoskeletal: Normal range of motion.  Neurological: She is alert and oriented to person, place, and time.  Skin: Skin is warm and dry.  Nursing note and vitals reviewed.    UC Treatments / Results  Labs (all labs ordered are listed, but only abnormal results are displayed) Labs Reviewed  POCT URINALYSIS DIP (DEVICE) - Abnormal; Notable for the following:       Result Value   Glucose, UA 100 (*)    Bilirubin Urine SMALL (*)    Ketones, ur TRACE (*)    Protein, ur 100 (*)    All other components within normal limits  POCT PREGNANCY, URINE    EKG  EKG Interpretation None       Radiology No results found.  Procedures Procedures (including critical care time)  Medications Ordered in  UC Medications - No data to display   Initial Impression / Assessment and Plan / UC Course  I have reviewed the triage vital signs and the nursing notes.  Pertinent labs & imaging results that were available during my care of the patient were reviewed by me and considered in my medical decision making (see chart for details).   Final Clinical Impressions(s) / UC Diagnoses   Final diagnoses:  Lipomatosis    New Prescriptions New Prescriptions   MELOXICAM (MOBIC) 7.5 MG TABLET    Take 1 tablet (7.5 mg total) by mouth daily.     Robyn Haber, MD 03/13/17 [phone]

## 2018-04-19 ENCOUNTER — Other Ambulatory Visit: Payer: Self-pay | Admitting: Obstetrics and Gynecology

## 2018-04-19 DIAGNOSIS — Z1231 Encounter for screening mammogram for malignant neoplasm of breast: Secondary | ICD-10-CM

## 2018-05-04 ENCOUNTER — Ambulatory Visit (HOSPITAL_COMMUNITY): Payer: Self-pay

## 2018-06-19 ENCOUNTER — Ambulatory Visit (INDEPENDENT_AMBULATORY_CARE_PROVIDER_SITE_OTHER): Payer: Self-pay | Admitting: Advanced Practice Midwife

## 2018-06-19 ENCOUNTER — Encounter: Payer: Self-pay | Admitting: Advanced Practice Midwife

## 2018-06-19 VITALS — BP 129/72 | HR 94 | Wt 245.9 lb

## 2018-06-19 DIAGNOSIS — N939 Abnormal uterine and vaginal bleeding, unspecified: Secondary | ICD-10-CM

## 2018-06-19 LAB — POCT PREGNANCY, URINE: Preg Test, Ur: NEGATIVE

## 2018-06-19 MED ORDER — MEGESTROL ACETATE 40 MG PO TABS
40.0000 mg | ORAL_TABLET | Freq: Two times a day (BID) | ORAL | 2 refills | Status: DC
Start: 2018-06-19 — End: 2018-07-06

## 2018-06-19 NOTE — Progress Notes (Signed)
  Subjective:     Patient ID: [name], female   DOB: 1966/01/04, 52 y.o.   MRN: [id number]  Christina Stanley is a 52 y.o. [id number] who is here today with vaginal bleeding. She had "fibroid surgery" in 2011 because she had heavy bleeding. She states that for about 6 months she has been bleeding off and on, heavy and with clots. This particular episode started on 06/03/18. It was heavy for about 6 days, but it has started to get better. Had bleeding approx 1/1/9 that seemed like a normal period at first but it continued for 14 days. The bleeding started again around 01/27/18 for approx 14 days. 02/24/18 with approx 14 days of bleeding, and then again on 04/16/18 for 7 days. She thought it was going to be normal. Then she started bleeding again on 05/16/18 for approx 14 days, and then again 06/03/17. She has been bleeding every day since 06/03/18.  Vaginal Bleeding  The patient's primary symptoms include vaginal bleeding. This is a new problem. The current episode started more than 1 month ago. The problem occurs intermittently. The problem has been unchanged. The patient is experiencing no pain. She is not pregnant. The vaginal discharge was bloody. The vaginal bleeding is heavier than menses. She has not been passing clots. She has been passing tissue. Nothing aggravates the symptoms. She has tried nothing for the symptoms. She is sexually active. Contraceptive use: Had both tubes removed due to ectopic pregnancy. Her past medical history is significant for an ectopic pregnancy, a gynecological surgery and menorrhagia. (Fibroids )     Review of Systems  Genitourinary: Positive for menorrhagia and vaginal bleeding.       Objective:   Physical Exam  Constitutional: She appears well-developed and well-nourished. No distress.  HENT:  Head: Normocephalic.  Cardiovascular: Normal rate.  Pulmonary/Chest: Effort normal.  Abdominal: Soft. There is no tenderness.  Genitourinary:  Genitourinary Comments:  External:  no lesion Vagina: small amount of blood noted  Cervix: unable to visualize cervix with speculum. On bimanual exam cervix was tilted anteriorly with a large, firm mass felt in the lower uterine segment. Cervix was too wedged between pubic bone and this mass for me to attempt to collect EMB.  Uterus: enlarged, firm, non-mobile    Skin: Skin is warm and dry.  Nursing note and vitals reviewed.      Assessment:     1. Abnormal uterine bleeding        Plan:     Unable to collect EMB today. [place] scheduled for Friday. Will start megace to help control sx today, and then have patient to return in 2 weeks for FU with MD for [place] results and assessment of this mass/possible fibroid

## 2018-06-19 NOTE — Patient Instructions (Signed)

## 2018-06-20 LAB — CBC
HEMATOCRIT: 21.7 % — AB (ref 34.0–46.6)
Hemoglobin: 6.1 g/dL — CL (ref 11.1–15.9)
MCH: 17.3 pg — AB (ref 26.6–33.0)
MCHC: 28.1 g/dL — ABNORMAL LOW (ref 31.5–35.7)
MCV: 62 fL — AB (ref 79–97)
PLATELETS: 394 10*3/uL (ref 150–450)
RBC: 3.53 x10E6/uL — AB (ref 3.77–5.28)
RDW: 21.9 % — ABNORMAL HIGH (ref 12.3–15.4)
WBC: 7.9 10*3/uL (ref 3.4–10.8)

## 2018-06-22 ENCOUNTER — Other Ambulatory Visit: Payer: Self-pay | Admitting: General Practice

## 2018-06-23 ENCOUNTER — Ambulatory Visit (HOSPITAL_COMMUNITY)
Admission: RE | Admit: 2018-06-23 | Discharge: 2018-06-23 | Disposition: A | Discharge status: Home / Self Care | Payer: Self-pay | Source: Ambulatory Visit | Attending: Advanced Practice Midwife | Admitting: Advanced Practice Midwife

## 2018-06-23 DIAGNOSIS — N939 Abnormal uterine and vaginal bleeding, unspecified: Secondary | ICD-10-CM | POA: Insufficient documentation

## 2018-06-23 DIAGNOSIS — D259 Leiomyoma of uterus, unspecified: Secondary | ICD-10-CM | POA: Insufficient documentation

## 2018-06-27 ENCOUNTER — Telehealth: Payer: Self-pay | Admitting: *Deleted

## 2018-06-27 NOTE — Telephone Encounter (Signed)
Received a voicemail from 1:48pm today that someone from our office was trying to reach them re: scheduling IV infusion x 4 - please call our office to schedule.  I called office at 5:01pm and left a message returning call- we will call again

## 2018-06-28 NOTE — Telephone Encounter (Signed)
Per chart review, patient has been scheduled for feraheme infusion on 7/10. Called patient, no answer- left message on voicemail stating we are trying to reach you regarding an appt we have set up for you, please call us back for more information. Will send mychart message.

## 2018-07-01 ENCOUNTER — Encounter: Payer: Self-pay | Admitting: Advanced Practice Midwife

## 2018-07-04 MED ORDER — SODIUM CHLORIDE 0.9 % IV SOLN
510.0000 mg | Freq: Once | INTRAVENOUS | Status: DC
  Filled 2018-07-04: qty 17

## 2018-07-05 ENCOUNTER — Other Ambulatory Visit: Payer: Self-pay

## 2018-07-05 ENCOUNTER — Ambulatory Visit (HOSPITAL_COMMUNITY)
Admission: RE | Admit: 2018-07-05 | Discharge: 2018-07-05 | Disposition: A | Discharge status: Home / Self Care | Payer: Self-pay | Source: Ambulatory Visit | Attending: Advanced Practice Midwife | Admitting: Advanced Practice Midwife

## 2018-07-05 ENCOUNTER — Ambulatory Visit (INDEPENDENT_AMBULATORY_CARE_PROVIDER_SITE_OTHER): Payer: Self-pay | Admitting: Family Medicine

## 2018-07-05 ENCOUNTER — Observation Stay (HOSPITAL_COMMUNITY)
Admission: AD | Admit: 2018-07-05 | Discharge: 2018-07-06 | Disposition: A | Discharge status: Home / Self Care | Payer: Self-pay | Source: Ambulatory Visit | Attending: Obstetrics and Gynecology | Admitting: Obstetrics and Gynecology

## 2018-07-05 VITALS — BP 148/80 | HR 68

## 2018-07-05 DIAGNOSIS — N939 Abnormal uterine and vaginal bleeding, unspecified: Principal | ICD-10-CM | POA: Insufficient documentation

## 2018-07-05 DIAGNOSIS — Z6841 Body Mass Index (BMI) 40.0 and over, adult: Secondary | ICD-10-CM | POA: Insufficient documentation

## 2018-07-05 DIAGNOSIS — D649 Anemia, unspecified: Secondary | ICD-10-CM | POA: Diagnosis present

## 2018-07-05 DIAGNOSIS — Z880 Allergy status to penicillin: Secondary | ICD-10-CM | POA: Insufficient documentation

## 2018-07-05 DIAGNOSIS — K219 Gastro-esophageal reflux disease without esophagitis: Secondary | ICD-10-CM | POA: Insufficient documentation

## 2018-07-05 DIAGNOSIS — S3141XA Laceration without foreign body of vagina and vulva, initial encounter: Secondary | ICD-10-CM | POA: Insufficient documentation

## 2018-07-05 DIAGNOSIS — D259 Leiomyoma of uterus, unspecified: Secondary | ICD-10-CM | POA: Insufficient documentation

## 2018-07-05 DIAGNOSIS — X58XXXA Exposure to other specified factors, initial encounter: Secondary | ICD-10-CM | POA: Insufficient documentation

## 2018-07-05 DIAGNOSIS — D250 Submucous leiomyoma of uterus: Secondary | ICD-10-CM

## 2018-07-05 DIAGNOSIS — R9389 Abnormal findings on diagnostic imaging of other specified body structures: Secondary | ICD-10-CM

## 2018-07-05 DIAGNOSIS — D251 Intramural leiomyoma of uterus: Secondary | ICD-10-CM

## 2018-07-05 DIAGNOSIS — D500 Iron deficiency anemia secondary to blood loss (chronic): Secondary | ICD-10-CM | POA: Insufficient documentation

## 2018-07-05 DIAGNOSIS — N920 Excessive and frequent menstruation with regular cycle: Secondary | ICD-10-CM

## 2018-07-05 LAB — COMPREHENSIVE METABOLIC PANEL
ALBUMIN: 3.7 g/dL (ref 3.5–5.0)
ALT: 11 U/L (ref 0–44)
ANION GAP: 8 (ref 5–15)
AST: 13 U/L — ABNORMAL LOW (ref 15–41)
Alkaline Phosphatase: 63 U/L (ref 38–126)
BUN: 12 mg/dL (ref 6–20)
CO2: 22 mmol/L (ref 22–32)
Calcium: 8.9 mg/dL (ref 8.9–10.3)
Chloride: 105 mmol/L (ref 98–111)
Creatinine, Ser: 0.74 mg/dL (ref 0.44–1.00)
GFR calc Af Amer: >60 mL/min (ref >60)
GFR calc non Af Amer: >60 mL/min (ref >60)
GLUCOSE: 89 mg/dL (ref 70–99)
POTASSIUM: 4.1 mmol/L (ref 3.5–5.1)
Sodium: 135 mmol/L (ref 135–145)
Total Bilirubin: 0.7 mg/dL (ref 0.3–1.2)
Total Protein: 7.8 g/dL (ref 6.5–8.1)

## 2018-07-05 LAB — CBC WITH DIFFERENTIAL/PLATELET
BASOS ABS: 0 10*3/uL (ref 0.0–0.1)
BASOS PCT: 1 %
Eosinophils Absolute: 0.2 10*3/uL (ref 0.0–0.7)
Eosinophils Relative: 4 %
HCT: 21.5 % — ABNORMAL LOW (ref 36.0–46.0)
Hemoglobin: 5.8 g/dL — CL (ref 12.0–15.0)
Lymphocytes Relative: 33 %
Lymphs Abs: 1.9 10*3/uL (ref 0.7–4.0)
MCH: 16.4 pg — AB (ref 26.0–34.0)
MCHC: 27 g/dL — AB (ref 30.0–36.0)
MCV: 60.9 fL — AB (ref 78.0–100.0)
Monocytes Absolute: 0.4 10*3/uL (ref 0.1–1.0)
Monocytes Relative: 7 %
NEUTROS ABS: 3.2 10*3/uL (ref 1.7–7.7)
NEUTROS PCT: 55 %
PLATELETS: 244 10*3/uL (ref 150–400)
RBC: 3.53 MIL/uL — ABNORMAL LOW (ref 3.87–5.11)
RDW: 21.9 % — ABNORMAL HIGH (ref 11.5–15.5)
WBC: 5.7 10*3/uL (ref 4.0–10.5)

## 2018-07-05 LAB — PREPARE RBC (CROSSMATCH)

## 2018-07-05 LAB — TSH: TSH: 2.715 u[IU]/mL (ref 0.350–4.500)

## 2018-07-05 MED ORDER — SODIUM CHLORIDE 0.9 % IV SOLN
INTRAVENOUS | Status: DC
  Administered 2018-07-06: 04:00:00 via INTRAVENOUS

## 2018-07-05 MED ORDER — SODIUM CHLORIDE 0.9% IV SOLUTION
Freq: Once | INTRAVENOUS | Status: AC
  Administered 2018-07-05: via INTRAVENOUS

## 2018-07-05 MED ORDER — IBUPROFEN 600 MG PO TABS: 600.0000 mg | ORAL_TABLET | Freq: Four times a day (QID) | ORAL | Status: DC | PRN

## 2018-07-05 MED ORDER — PRENATAL MULTIVITAMIN CH: 1.0000 | ORAL_TABLET | Freq: Every day | ORAL | Status: DC

## 2018-07-05 MED ORDER — SODIUM CHLORIDE 0.9% IV SOLUTION: Freq: Once | INTRAVENOUS | Status: DC

## 2018-07-05 NOTE — H&P (Signed)
Christina Stanley is an 52 y.o. female. [id number] who is here today with vaginal bleeding. She had "fibroid surgery" in 2011 because she had heavy bleeding. She states that for about 6 months she has been bleeding off and on, heavy and with clots. This particular episode started on 06/03/18. It was heavy for about 6 days, but it has started to get better. Had bleeding approx 1/1/9 that seemed like a normal period at first but it continued for 14 days. The bleeding started again around 01/27/18 for approx 14 days. 02/24/18 with approx 14 days of bleeding, and then again on 04/16/18 for 7 days. She thought it was going to be normal. Then she started bleeding again on 05/16/18 for approx 14 days, and then again 06/03/17. She has been bleeding every day since 06/03/18.  Vaginal Bleeding  The patient's primary symptoms include vaginal bleeding. This is a new problem. The current episode started more than 6 months ago. The problem occurs intermittently. The problem has been unchanged. The patient is experiencing no pain. She is not pregnant. The vaginal discharge was bloody. The vaginal bleeding is heavier than menses. She has not been passing clots. She has been passing tissue. Nothing aggravates the symptoms. She has tried nothing for the symptoms. She is sexually active. Contraceptive use: Had both tubes removed due to ectopic pregnancy. Her past medical history is significant for an ectopic pregnancy 04/2011, a gynecological surgery and menorrhagia. (Fibroids )   *  Pertinent Gynecological History: Menses: flow is excessive with use of both pads or tampons on heaviest days Bleeding: dysfunctional uterine bleeding has bled regularly up to 20 days per month since 6-8 months ago  Contraception: both tubes removed for ectopic pregnancy DES exposure: unknown Blood transfusions: 2 units in 2012, 3 units planned for tonight Sexually transmitted diseases: no past history Previous GYN Procedures: ectopic surgeries x 2   Last  mammogram: normal Date: 2011 Last pap: normal Date: 2013 OB History: G3, P1021   Menstrual History: Menarche age:  No LMP recorded (lmp unknown). bleeding x several weeks.    Past Medical History:  Diagnosis Date  . Fibroids     Past Surgical History:  Procedure Laterality Date  . ECTOPIC PREGNANCY SURGERY     two pregnancy  . SALPINGECTOMY    . UTERINE FIBROID SURGERY  08/12/2010   diagnositic hysterscopy with D&C and removal of central fibroid.     Family History  Problem Relation Age of Onset  . Diabetes Mother   . Hypertension Mother   . Cancer Father        colon  . Diabetes Father   . Hypertension Father   . Diabetes Sister   . Hypertension Sister   . Cancer Maternal Grandmother     Social History:  reports that she has never smoked. She has never used smokeless tobacco. She reports that she does not drink alcohol or use drugs.  Allergies:  Allergies  Allergen Reactions  . Amoxicillin     Medications Prior to Admission  Medication Sig Dispense Refill Last Dose  . megestrol (MEGACE) 40 MG tablet Take 1 tablet (40 mg total) by mouth 2 (two) times daily. 60 tablet 2   . meloxicam (MOBIC) 7.5 MG tablet Take 1 tablet (7.5 mg total) by mouth daily. (Patient not taking: Reported on 06/19/2018) 20 tablet 0 Not Taking    ROS  Blood pressure (!) 111/97, pulse 76, temperature 98.4 F (36.9 C), temperature source Oral, resp. rate 18, height 5'  6" (1.676 m), weight 245 lb (111.1 kg). Physical Exam  Constitutional: She appears well-developed and well-nourished. No distress.  HENT:  Head: Normocephalic.  Eyes: Pupils are equal, round, and reactive to light.  Neck: Normal range of motion.  Cardiovascular: Normal rate.  Respiratory: Effort normal.  GI: Soft. She exhibits mass.  Large fibroid uterus to 10 cm above umbilicus   prior effort at pap by heather hogan notes a fibroid mass effect with blocks the vagina. The cervix cannot be well visualized but is palpated  on bimanual as located pushed anterior behind the symphysis  ULTRASOUND FROM 6/28. CLINICAL DATA:  Abnormal uterine bleeding for 6 months. Bleeding is heavy, with clots. History of fibroid surgery, bilateral salpingectomy secondary to ectopic pregnancy. History of menorrhagia.  EXAM: TRANSABDOMINAL ULTRASOUND OF PELVIS  TECHNIQUE: Transabdominal ultrasound examination of the pelvis was performed including evaluation of the uterus, ovaries, adnexal regions, and pelvic cul-de-sac.  COMPARISON:  Ultrasound 02/04/2010  FINDINGS: Uterus  Measurements: At least and probably greater than 60.9 x 10.7 x 11.2 centimeters. Numerous fibroids enlarged and distort the uterine anatomy.  Large fundal fibroid is 10.5 x 9.1 x 12 centimeters.  Fibroid near the cervix is 7.2 x 9.2 x 6.4 centimeters.  Posterior fibroid is 3.4 x 2.5 x 3.4 centimeters.  Posterior fibroid is 4.9 x 1.6 x 3.7 centimeters.  Endometrium  Thickness: Difficult to visualized but estimated to measure 28 millimeters. No focal abnormality visualized.  Right ovary  Measurements: The ovary is not visualized, either absent or obscured. No adnexal mass.  Left ovary  Measurements: The ovary is not visualized, either absent or obscured. No adnexal mass.  Other findings:  No free pelvic fluid.  IMPRESSION: 1. Enlarged uterus containing numerous fibroids. 2. Endometrial stripe is difficult to measure given the distortion of the uterine anatomy but is estimated to be 28 millimeters. 3. If bleeding remains unresponsive to hormonal or medical therapy, focal lesion work-up with sonohysterogram should be considered. Endometrial biopsy should also be considered in pre-menopausal patients at high risk for endometrial carcinoma. (Ref: Radiological Reasoning: Algorithmic Workup of Abnormal Vaginal Bleeding with Endovaginal Sonography and Sonohysterography. AJR 2008; 425:Z56-38) 4. Nonvisualized  ovaries.   Electronically Signed   By: Nolon Nations M.D.   On: 06/23/2018 11:26 CBC    Component Value Date/Time   WBC 7.9 06/19/2018 1752   WBC 11.3 (H) 08/10/2010 1200   RBC 3.53 (L) 06/19/2018 1752   RBC 3.48 (L) 08/10/2010 1200   HGB 6.1 (LL) 06/19/2018 1752   HCT 21.7 (L) 06/19/2018 1752   PLT 394 06/19/2018 1752   MCV 62 (L) 06/19/2018 1752   MCH 17.3 (L) 06/19/2018 1752   MCH 21.3 (L) 08/10/2010 1200   MCHC 28.1 (L) 06/19/2018 1752   MCHC 30.7 RESULT CHECKED 08/10/2010 1200   RDW 21.9 (H) 06/19/2018 1752   LYMPHSABS 1.4 05/16/2010 1402   MONOABS 1.0 05/16/2010 1402   EOSABS 0.2 05/16/2010 1402   BASOSABS 0.0 05/16/2010 1402     Assessment/Plan: 1. MENORRHAGIA WITH ANEMIA 2 FIBROID UTERUS WITH THICKENED ENDOMETRIUM 3. UNABLE TO ACCESS THE CERVIX FOR PAP OR ENDOMETRIAL BIOPSY DUE TO FIBROID DEFORMITY OF THE UTERUS  PLAN: 1. TRANSFUSE 3 UNITS PRBC 2  NPO P MN TO OR IN AM FOR PAP AND AN ENDOMETRIAL BIOPSY OR D&C TO ASSESS THICKENED ENDOMETRIUM 3 PT COUNSELLED OVER RISKS OF TRANSFUSION , AND AGREES TO TRANSFUSION.  Jonnie Kind 07/05/2018, 7:11 PM

## 2018-07-05 NOTE — Progress Notes (Signed)
   Subjective:    Patient ID: Christina Stanley is a 52 y.o. female presenting with No chief complaint on file.  on 07/05/2018  HPI: Pt reports that she is currently not bleeding, but expects to start in the next 5 days; and endorses irregular periods that are long. Attempt at pap and endometrial biopsy unable to be obtained last week. She returns after u/s. U/s revealed uterus > 60 cm with endometrial stripe of approx. 2.8 cm. Hgb found to be 6.1. Missed iron infusion. Endorses fatigue, SOB.    Review of Systems: positive for shortness of breath and dizziness, ; pt reports tactile fever yesterday evening and alleviated it with two tylenol; denies chest pain sore throat, changes in vision/hearing     Objective:    BP (!) 148/80   Pulse 68   LMP  (LMP Unknown)  Physical Exam  Constitutional: She is oriented to person, place, and time. She appears well-developed and well-nourished.  HENT:  Head: Normocephalic and atraumatic.  Eyes: Pupils are equal, round, and reactive to light. No scleral icterus.  Neck: Normal range of motion. No thyromegaly present.  Cardiovascular: Normal rate, regular rhythm and intact distal pulses.  Pulmonary/Chest: Effort normal and breath sounds normal.  Abdominal: Soft. She exhibits no distension. There is no tenderness.  Neurological: She is alert and oriented to person, place, and time.  Skin: Skin is warm and dry.        Assessment & Plan:   Problem List Items Addressed This Visit      Unprioritized   Leiomyoma of uterus, unspecified    Markedly enlarged uterus. Unable to obtain pap or EMB. Consider DepoLupron vs. Referral to GYN/ONC      Excessive or frequent menstruation - Primary    Markedly anemic--admission with transfusion and attempt at sampling.      Endometrial thickening on ultrasound    Needs sampling.         Total face-to-face time with patient: 25 minutes. Over 50% of encounter was spent on counseling and coordination of  care. Return in about 1 month (around 08/02/2018).  Donnamae Jude 07/05/2018 4:31 PM

## 2018-07-06 ENCOUNTER — Observation Stay (HOSPITAL_COMMUNITY): Payer: Self-pay | Admitting: Anesthesiology

## 2018-07-06 ENCOUNTER — Encounter (HOSPITAL_COMMUNITY)
Admission: AD | Disposition: A | Discharge status: Home / Self Care | Payer: Self-pay | Source: Ambulatory Visit | Attending: Obstetrics and Gynecology

## 2018-07-06 ENCOUNTER — Encounter: Payer: Self-pay | Admitting: Family Medicine

## 2018-07-06 DIAGNOSIS — D259 Leiomyoma of uterus, unspecified: Secondary | ICD-10-CM

## 2018-07-06 DIAGNOSIS — D500 Iron deficiency anemia secondary to blood loss (chronic): Secondary | ICD-10-CM

## 2018-07-06 DIAGNOSIS — N938 Other specified abnormal uterine and vaginal bleeding: Secondary | ICD-10-CM

## 2018-07-06 DIAGNOSIS — R9389 Abnormal findings on diagnostic imaging of other specified body structures: Secondary | ICD-10-CM | POA: Insufficient documentation

## 2018-07-06 HISTORY — PX: DILATION AND CURETTAGE OF UTERUS: SHX78

## 2018-07-06 LAB — CBC WITH DIFFERENTIAL/PLATELET
BASOS PCT: 0 %
Basophils Absolute: 0 10*3/uL (ref 0.0–0.1)
Eosinophils Absolute: 0.2 10*3/uL (ref 0.0–0.7)
Eosinophils Relative: 2 %
HEMATOCRIT: 29.3 % — AB (ref 36.0–46.0)
HEMOGLOBIN: 8.7 g/dL — AB (ref 12.0–15.0)
LYMPHS PCT: 16 %
Lymphs Abs: 1.8 10*3/uL (ref 0.7–4.0)
MCH: 19.7 pg — ABNORMAL LOW (ref 26.0–34.0)
MCHC: 29.7 g/dL — AB (ref 30.0–36.0)
MCV: 66.4 fL — AB (ref 78.0–100.0)
Monocytes Absolute: 0.3 10*3/uL (ref 0.1–1.0)
Monocytes Relative: 3 %
NEUTROS ABS: 9.2 10*3/uL — AB (ref 1.7–7.7)
NEUTROS PCT: 79 %
Platelets: 256 10*3/uL (ref 150–400)
RBC: 4.41 MIL/uL (ref 3.87–5.11)
RDW: 24.9 % — AB (ref 11.5–15.5)
WBC: 11.5 10*3/uL — ABNORMAL HIGH (ref 4.0–10.5)

## 2018-07-06 LAB — URINALYSIS, ROUTINE W REFLEX MICROSCOPIC
BILIRUBIN URINE: NEGATIVE
Glucose, UA: NEGATIVE mg/dL
Hgb urine dipstick: NEGATIVE
KETONES UR: NEGATIVE mg/dL
Nitrite: NEGATIVE
PH: 6 (ref 5.0–8.0)
Protein, ur: NEGATIVE mg/dL
Specific Gravity, Urine: 1.017 (ref 1.005–1.030)

## 2018-07-06 LAB — HIV ANTIBODY (ROUTINE TESTING W REFLEX): HIV Screen 4th Generation wRfx: NONREACTIVE

## 2018-07-06 LAB — HEPATITIS C ANTIBODY: HCV Ab: <0.1 s/co ratio (ref 0.0–0.9)

## 2018-07-06 LAB — PREPARE RBC (CROSSMATCH)

## 2018-07-06 SURGERY — DILATION AND EVACUATION, UTERUS
Anesthesia: Choice

## 2018-07-06 SURGERY — DILATION AND CURETTAGE
Anesthesia: Choice

## 2018-07-06 MED ORDER — FENTANYL CITRATE (PF) 250 MCG/5ML IJ SOLN
INTRAMUSCULAR | Status: AC
  Filled 2018-07-06: qty 5

## 2018-07-06 MED ORDER — SUCCINYLCHOLINE CHLORIDE 20 MG/ML IJ SOLN
INTRAMUSCULAR | Status: DC | PRN
  Administered 2018-07-06: 110 mg via INTRAVENOUS

## 2018-07-06 MED ORDER — SODIUM CHLORIDE 0.9% IV SOLUTION: Freq: Once | INTRAVENOUS | Status: DC

## 2018-07-06 MED ORDER — LACTATED RINGERS IV SOLN
INTRAVENOUS | Status: DC | PRN
  Administered 2018-07-06: 11:00:00 via INTRAVENOUS

## 2018-07-06 MED ORDER — MEPERIDINE HCL 25 MG/ML IJ SOLN: 6.2500 mg | INTRAMUSCULAR | Status: DC | PRN

## 2018-07-06 MED ORDER — KETOROLAC TROMETHAMINE 30 MG/ML IJ SOLN
INTRAMUSCULAR | Status: AC
  Filled 2018-07-06: qty 1

## 2018-07-06 MED ORDER — PROMETHAZINE HCL 25 MG/ML IJ SOLN: 6.2500 mg | INTRAMUSCULAR | Status: DC | PRN

## 2018-07-06 MED ORDER — LIDOCAINE HCL (CARDIAC) PF 100 MG/5ML IV SOSY
PREFILLED_SYRINGE | INTRAVENOUS | Status: AC
  Filled 2018-07-06: qty 5

## 2018-07-06 MED ORDER — SUCCINYLCHOLINE CHLORIDE 200 MG/10ML IV SOSY
PREFILLED_SYRINGE | INTRAVENOUS | Status: AC
  Filled 2018-07-06: qty 10

## 2018-07-06 MED ORDER — FENTANYL CITRATE (PF) 100 MCG/2ML IJ SOLN: 25.0000 ug | INTRAMUSCULAR | Status: DC | PRN

## 2018-07-06 MED ORDER — PROPOFOL 10 MG/ML IV BOLUS
INTRAVENOUS | Status: AC
  Filled 2018-07-06: qty 20

## 2018-07-06 MED ORDER — KETOROLAC TROMETHAMINE 30 MG/ML IJ SOLN
INTRAMUSCULAR | Status: DC | PRN
  Administered 2018-07-06: 30 mg via INTRAVENOUS

## 2018-07-06 MED ORDER — EPHEDRINE SULFATE 50 MG/ML IJ SOLN
INTRAMUSCULAR | Status: DC | PRN
  Administered 2018-07-06: 10 mg via INTRAVENOUS

## 2018-07-06 MED ORDER — PROPOFOL 10 MG/ML IV BOLUS
INTRAVENOUS | Status: DC | PRN
  Administered 2018-07-06: 150 mg via INTRAVENOUS
  Administered 2018-07-06: 50 mg via INTRAVENOUS

## 2018-07-06 MED ORDER — FERUMOXYTOL INJECTION 510 MG/17 ML
510.0000 mg | Freq: Once | INTRAVENOUS | Status: AC
  Administered 2018-07-06: 510 mg via INTRAVENOUS
  Filled 2018-07-06: qty 17

## 2018-07-06 MED ORDER — EPHEDRINE 5 MG/ML INJ
INTRAVENOUS | Status: AC
  Filled 2018-07-06: qty 10

## 2018-07-06 MED ORDER — MIDAZOLAM HCL 2 MG/2ML IJ SOLN
INTRAMUSCULAR | Status: AC
  Filled 2018-07-06: qty 2

## 2018-07-06 MED ORDER — LACTATED RINGERS IV SOLN: INTRAVENOUS | Status: DC

## 2018-07-06 MED ORDER — ONDANSETRON HCL 4 MG/2ML IJ SOLN
INTRAMUSCULAR | Status: DC | PRN
  Administered 2018-07-06: 4 mg via INTRAVENOUS

## 2018-07-06 MED ORDER — FENTANYL CITRATE (PF) 100 MCG/2ML IJ SOLN
INTRAMUSCULAR | Status: DC | PRN
  Administered 2018-07-06: 100 ug via INTRAVENOUS
  Administered 2018-07-06 (×2): 50 ug via INTRAVENOUS

## 2018-07-06 MED ORDER — MIDAZOLAM HCL 2 MG/2ML IJ SOLN: 0.5000 mg | Freq: Once | INTRAMUSCULAR | Status: DC | PRN

## 2018-07-06 MED ORDER — SCOPOLAMINE 1 MG/3DAYS TD PT72
MEDICATED_PATCH | TRANSDERMAL | Status: DC | PRN
  Administered 2018-07-06: 1 via TRANSDERMAL

## 2018-07-06 MED ORDER — IBUPROFEN 600 MG PO TABS: 600.0000 mg | ORAL_TABLET | Freq: Four times a day (QID) | ORAL | 0 refills | Status: DC | PRN

## 2018-07-06 MED ORDER — MEGESTROL ACETATE 40 MG PO TABS: 40.0000 mg | ORAL_TABLET | Freq: Two times a day (BID) | ORAL | 5 refills | Status: DC

## 2018-07-06 MED ORDER — MIDAZOLAM HCL 2 MG/2ML IJ SOLN
INTRAMUSCULAR | Status: DC | PRN
  Administered 2018-07-06: 2 mg via INTRAVENOUS

## 2018-07-06 MED ORDER — LIDOCAINE HCL (CARDIAC) PF 100 MG/5ML IV SOSY
PREFILLED_SYRINGE | INTRAVENOUS | Status: DC | PRN
  Administered 2018-07-06: 100 mg via INTRAVENOUS

## 2018-07-06 MED ORDER — DEXAMETHASONE SODIUM PHOSPHATE 10 MG/ML IJ SOLN
INTRAMUSCULAR | Status: DC | PRN
  Administered 2018-07-06: 4 mg via INTRAVENOUS

## 2018-07-06 MED ORDER — SCOPOLAMINE 1 MG/3DAYS TD PT72
MEDICATED_PATCH | TRANSDERMAL | Status: AC
  Filled 2018-07-06: qty 1

## 2018-07-06 SURGICAL SUPPLY — 16 items
CANISTER SUCT 3000ML PPV (MISCELLANEOUS) ×1 IMPLANT
CATH ROBINSON RED A/P 16FR (CATHETERS) ×3 IMPLANT
CNTNR SPEC C3OZ STD GRAD LEK (MISCELLANEOUS) ×1 IMPLANT
CONT SPEC 3OZ W/LID STRL (MISCELLANEOUS) ×3
GLOVE BIO SURGEON STRL SZ7.5 (GLOVE) ×3 IMPLANT
GLOVE BIOGEL PI IND STRL 7.0 (GLOVE) ×1 IMPLANT
GLOVE BIOGEL PI INDICATOR 7.0 (GLOVE) ×2
GOWN STRL REUS W/TWL LRG LVL3 (GOWN DISPOSABLE) ×3 IMPLANT
GOWN STRL REUS W/TWL XL LVL3 (GOWN DISPOSABLE) ×3 IMPLANT
PACK VAGINAL MINOR WOMEN LF (CUSTOM PROCEDURE TRAY) ×3 IMPLANT
PAD OB MATERNITY 4.3X12.25 (PERSONAL CARE ITEMS) ×3 IMPLANT
PAD PREP 24X48 CUFFED NSTRL (MISCELLANEOUS) ×3 IMPLANT
PIPET BIOPSY ENDOMETRIAL 3MM (SUCTIONS) ×2 IMPLANT
SUT VIC AB 2-0 CT1 27 (SUTURE) ×3
SUT VIC AB 2-0 CT1 TAPERPNT 27 (SUTURE) IMPLANT
TOWEL OR 17X24 6PK STRL BLUE (TOWEL DISPOSABLE) ×6 IMPLANT

## 2018-07-06 NOTE — Progress Notes (Signed)
Patient ID: Christina Stanley, female   DOB: 1966-09-18, 52 y.o.   MRN: [id number] Ms Amico is without complaints this morning. Has completed blood transfusion without problems NPO since midnight  PE AF VSS Lungs clear Heart RRR Abd soft + BS obese  A/P Anemia         Uterine fibroids and thicken endometrium  Pt for EUA, pap smear and EMBX. R./B of procedure have been reviewed with pt. She has verbalized understanding. To OR when ready.

## 2018-07-06 NOTE — Progress Notes (Signed)
Pt  Out in wheelchair   Teaching complete  

## 2018-07-06 NOTE — Transfer of Care (Signed)
Immediate Anesthesia Transfer of Care Note  Patient: Christina Stanley  Procedure(s) Performed: VAGINAL MUCOSA LACERATION REPAIR (N/A ) EXAM UNDER ANESTHESIA (N/A )  Patient Location: PACU  Anesthesia Type:General  Level of Consciousness: awake, alert  and oriented  Airway & Oxygen Therapy: Patient Spontanous Breathing and Patient connected to nasal cannula oxygen  Post-op Assessment: Report given to RN and Post -op Vital signs reviewed and stable  Post vital signs: Reviewed and stable  Last Vitals:  Vitals Value Taken Time  BP 140/82 07/06/2018 11:30 AM  Temp    Pulse 95 07/06/2018 11:32 AM  Resp 19 07/06/2018 11:32 AM  SpO2 95 % 07/06/2018 11:32 AM  Vitals shown include unvalidated device data.  Last Pain:  Vitals:   07/06/18 0957  TempSrc: Oral  PainSc:          Complications: No apparent anesthesia complications

## 2018-07-06 NOTE — Anesthesia Postprocedure Evaluation (Signed)
Anesthesia Post Note  Patient: Christina Stanley  Procedure(s) Performed: VAGINAL MUCOSA LACERATION REPAIR (N/A ) EXAM UNDER ANESTHESIA (N/A )     Patient location during evaluation: PACU Anesthesia Type: General Level of consciousness: awake and alert, oriented and patient cooperative Pain management: pain level controlled Vital Signs Assessment: post-procedure vital signs reviewed and stable Respiratory status: spontaneous breathing, nonlabored ventilation and respiratory function stable Cardiovascular status: blood pressure returned to baseline and stable Postop Assessment: no apparent nausea or vomiting Anesthetic complications: no    Last Vitals:  Vitals:   07/06/18 1302 07/06/18 1403  BP: 117/64 122/84  Pulse: 78 63  Resp: 18 18  Temp: 36.8 C 36.9 C  SpO2: 98% 96%    Last Pain:  Vitals:   07/06/18 1403  TempSrc: Oral  PainSc:    Pain Goal: Patients Stated Pain Goal: 2 (07/06/18 1302)               Seleta Rhymes. Maisy Newport

## 2018-07-06 NOTE — Discharge Summary (Signed)
Physician Discharge Summary  Patient ID: Christina Stanley MRN: [id number] DOB/AGE: [birth year] 52 y.o.  Admit date: 07/05/2018 Discharge date: 07/06/2018  Admission Diagnoses: Anemia secondary to chronic blood loss, Uterine fibroids  Discharge Diagnoses:  Active Problems:   Anemia acute on chronic   Discharged Condition: good  Hospital Course: Christina Stanley was admitted for blood transfusion secondary to above. She was transfused with 3 units PRBC's without problems. Underwent EUA on 07/06/18. Unable to visualize cervix or perform EMBX. See OP note for additional information.  Pt's post op course was unremarkable. Progressed to ambulating and voiding without problems. Tolerating diet. Pt amendable for discharge home. Discharge medications and follow up reviewed with pt. Pt verbalized understanding.   Consults: None  Significant Diagnostic Studies: labs, blood transfusion  Treatments: surgery: EUA  Discharge Exam: Blood pressure 117/64, pulse 78, temperature 98.2 F (36.8 C), temperature source Oral, resp. rate 18, height 5\' 6"  (1.676 m), weight 112.9 kg (249 lb), SpO2 98 %. Lungs clear Heart RRR Abd soft + BS abd/pelvic mass effect GU deferred Ext non tender  Disposition: Discharge disposition: 01-Home or Self Care       Discharge Instructions    Call MD for:  difficulty breathing, headache or visual disturbances   Complete by:  As directed    Call MD for:  extreme fatigue   Complete by:  As directed    Call MD for:  persistant dizziness or light-headedness   Complete by:  As directed    Call MD for:  persistant nausea and vomiting   Complete by:  As directed    Call MD for:  redness, tenderness, or signs of infection (pain, swelling, redness, odor or green/yellow discharge around incision site)   Complete by:  As directed    Call MD for:  severe uncontrolled pain   Complete by:  As directed    Call MD for:  temperature >100.4   Complete by:  As directed    Diet - low sodium  heart healthy   Complete by:  As directed    Increase activity slowly   Complete by:  As directed      Allergies as of 07/06/2018      Reactions   Amoxicillin Rash   Has patient had a PCN reaction causing immediate rash, facial/tongue/throat swelling, SOB or lightheadedness with hypotension: No Has patient had a PCN reaction causing severe rash involving mucus membranes or skin necrosis: No Has patient had a PCN reaction that required hospitalization: No Has patient had a PCN reaction occurring within the last 10 years: No If all of the above answers are "NO", then may proceed with Cephalosporin use.      Medication List    STOP taking these medications   meloxicam 7.5 MG tablet Commonly known as:  MOBIC     TAKE these medications   ibuprofen 600 MG tablet Commonly known as:  ADVIL,MOTRIN Take 1 tablet (600 mg total) by mouth every 6 (six) hours as needed (mild pain).   megestrol 40 MG tablet Commonly known as:  MEGACE Take 1 tablet (40 mg total) by mouth 2 (two) times daily. Can increase to two tablets twice a day in the event of heavy bleeding What changed:  additional instructions      Follow-up Information    [place]. Schedule an appointment as soon as possible for a visit in 3 week(s).   Why:  Post op appt with Dr. Darron Doom. Contact information: [address]  [place] [phone]          Signed: Chancy Milroy 07/06/2018, 2:02 PM

## 2018-07-06 NOTE — Assessment & Plan Note (Signed)
Markedly enlarged uterus. Unable to obtain pap or EMB. Consider DepoLupron vs. Referral to GYN/ONC

## 2018-07-06 NOTE — Assessment & Plan Note (Signed)
Needs sampling.

## 2018-07-06 NOTE — Op Note (Signed)
Christina Stanley PROCEDURE DATE: 07/06/2018  PREOPERATIVE DIAGNOSES: AUB, uterine fibroids, anemia POSTOPERATIVE DIAGNOSES: The same PROCEDURE: Exam under anesthisa SURGEON:  Dr Arlina Robes ASSISTANT: Dr. Blanch Media  ANESTHESIOLOGIST: See record  INDICATIONS: 52 y.o. [id number] here for EUA, pap smear and EMBX Risks of surgery were discussed with the patient including but not limited to: bleeding which may require transfusion or reoperation; infection which may require antibiotics; injury to bowel, bladder, ureters or other surrounding organs; need for additional procedures; thromboembolic phenomenon, incisional problems and other postoperative/anesthesia complications. Written informed consent was obtained.    FINDINGS:  Enlarged uterus > 20 weeks with posterior mass affect to the lower uterine segment felt to be fibroid, unable to visualize cervix. Vaginal laceration repaired  ANESTHESIA:    General INTRAVENOUS FLUIDS: As recorded ESTIMATED BLOOD LOSS:25 cc URINE OUTPUT: 25 ml SPECIMENS: none COMPLICATIONS: None immediate  PROCEDURE IN DETAIL:  The patient received intravenous antibiotics and had sequential compression devices applied to her lower extremities while in the preoperative area.  She was then taken to the operating room where general anesthesia was administered and was found to be adequate.  She was placed in the dorsal lithotomy/supine position, and was prepped and draped in a sterile manner.   Her bladder was emptied with red rubber cathter.  After an adequate timeout was performed, attention was turned to the procedure. Normal external genital were noted. The above finding were noted. I was unable to visualize the cervix. I asked to Dr Doreene Burke Tamala Julian to examine pt as well. She as well was unable to visualize. A small laceration was noted and repaired. The procedure was abandoned and pt was taken to PACU in stable condition. Counts x 2 were correct.

## 2018-07-06 NOTE — Progress Notes (Addendum)
Blood transfusion completed, no reactions observed. Patient tolerated it well.  CBC ordered for 6:30 am.

## 2018-07-06 NOTE — Discharge Instructions (Signed)

## 2018-07-06 NOTE — Anesthesia Procedure Notes (Signed)
Procedure Name: Intubation Date/Time: 07/06/2018 10:37 AM Performed by: Jonna Munro, CRNA Pre-anesthesia Checklist: Patient identified, Emergency Drugs available, Suction available, Patient being monitored and Timeout performed Patient Re-evaluated:Patient Re-evaluated prior to induction Oxygen Delivery Method: Circle system utilized Preoxygenation: Pre-oxygenation with 100% oxygen Induction Type: IV induction, Cricoid Pressure applied and Rapid sequence Laryngoscope Size: Mac and 3 Grade View: Grade I Tube type: Oral Tube size: 7.0 mm Number of attempts: 1 Airway Equipment and Method: Stylet Placement Confirmation: ETT inserted through vocal cords under direct vision,  positive ETCO2 and breath sounds checked- equal and bilateral Secured at: 23 cm Tube secured with: Tape Dental Injury: Teeth and Oropharynx as per pre-operative assessment

## 2018-07-06 NOTE — Assessment & Plan Note (Signed)
Markedly anemic--admission with transfusion and attempt at sampling.

## 2018-07-06 NOTE — Progress Notes (Signed)
Subjective:  Pt npo for Dilation and Curettage, Exam under anesthesia with Pap  Patient reports tolerating transfusions well..  Feels well. Pt is NPO She is NOT currently bleeding, had only light menstrual type bleeding on the 8 July, and none since. In June she bled approx 20 days. Objective: I have reviewed patient's vital signs, intake and output and labs. CBC Latest Ref Rng & Units 07/06/2018 07/05/2018 06/19/2018  WBC 4.0 - 10.5 K/uL 11.5(H) 5.7 7.9  Hemoglobin 12.0 - 15.0 g/dL 8.7(L) 5.8(LL) 6.1(LL)  Hematocrit 36.0 - 46.0 % 29.3(L) 21.5(L) 21.7(L)  Platelets 150 - 400 K/uL 256 244 394    General: alert, cooperative and appears stated age Resp: clear to auscultation bilaterally GI: soft, non-tender; bowel sounds normal; no masses,  no organomegaly and nontender fibroid uterus 24 wk size Vaginal Bleeding: none   Assessment/Plan: AUB, with uterine fibroids and thickened endometrium, for Pap and endometrial sampling this am by Dr Rip Harbour. Improved anemia, tolerated by pt.  Will crossmatch 2 units this a.m in case bleeding encountered, or additional transfusion preferred by anesthesia. Consent ordered for Dilation and Curettage, exam under anesthesia with pap, surgery plans discussed with pt including risks of additional bleeding, further transfusion. Pt was scheduled to receive FeraHeme this a.m. To help replenish iron stores. Depending on Pathology, to be considered for Lupron to shrink fibroids and ease technical aspects to future hysterectomy. Discussed with Dr Rip Harbour.  LOS: 1 day    Christina Stanley 07/06/2018, 7:36 AM

## 2018-07-06 NOTE — Anesthesia Preprocedure Evaluation (Signed)
Anesthesia Evaluation  Patient identified by MRN, date of birth, ID band Patient awake    Reviewed: Allergy & Precautions, NPO status , Patient's Chart, lab work & pertinent test results  History of Anesthesia Complications Negative for: history of anesthetic complications  Airway Mallampati: II  TM Distance: >3 FB Neck ROM: Full    Dental  (+) Poor Dentition, Chipped, Loose, Dental Advisory Given   Pulmonary neg pulmonary ROS,    breath sounds clear to auscultation       Cardiovascular negative cardio ROS   Rhythm:Regular Rate:Normal     Neuro/Psych negative neurological ROS     GI/Hepatic Neg liver ROS, GERD  Poorly Controlled,  Endo/Other  Morbid obesity  Renal/GU negative Renal ROS     Musculoskeletal   Abdominal (+) + obese,   Peds  Hematology  (+) Blood dyscrasia (Hb 8.7 s/p transfusion), anemia ,   Anesthesia Other Findings   Reproductive/Obstetrics                             Anesthesia Physical Anesthesia Plan  ASA: III  Anesthesia Plan: General   Post-op Pain Management:    Induction: Intravenous  PONV Risk Score and Plan: 3 and Ondansetron, Dexamethasone and Scopolamine patch - Pre-op  Airway Management Planned: Oral ETT  Additional Equipment:   Intra-op Plan:   Post-operative Plan: Extubation in OR  Informed Consent: I have reviewed the patients History and Physical, chart, labs and discussed the procedure including the risks, benefits and alternatives for the proposed anesthesia with the patient or authorized representative who has indicated his/her understanding and acceptance.   Dental advisory given  Plan Discussed with: CRNA and Surgeon  Anesthesia Plan Comments: (Plan routine monitors, GETA)        Anesthesia Quick Evaluation

## 2018-07-07 ENCOUNTER — Telehealth: Payer: Self-pay

## 2018-07-07 ENCOUNTER — Telehealth: Payer: Self-pay | Admitting: General Practice

## 2018-07-07 ENCOUNTER — Other Ambulatory Visit: Payer: Self-pay | Admitting: General Practice

## 2018-07-07 ENCOUNTER — Encounter (HOSPITAL_COMMUNITY): Payer: Self-pay | Admitting: Obstetrics and Gynecology

## 2018-07-07 DIAGNOSIS — R9389 Abnormal findings on diagnostic imaging of other specified body structures: Secondary | ICD-10-CM

## 2018-07-07 DIAGNOSIS — N938 Other specified abnormal uterine and vaginal bleeding: Secondary | ICD-10-CM

## 2018-07-07 DIAGNOSIS — N852 Hypertrophy of uterus: Secondary | ICD-10-CM

## 2018-07-07 NOTE — Telephone Encounter (Signed)
-----   Message from Donnamae Jude, MD sent at 07/06/2018  9:28 PM EDT ----- Please refer to gyn/Onc for eval and possible hyst

## 2018-07-07 NOTE — Telephone Encounter (Signed)
Patient has been scheduled with GYN/ONC 7/22 @ 10am. Called & informed patient. Patient verbalized understanding & had no questions.

## 2018-07-07 NOTE — Telephone Encounter (Signed)
Called GYN/ONC- they will review patient's chart & will call back with the appt.

## 2018-07-07 NOTE — Telephone Encounter (Signed)
Received call from [facility] at [facility] regarding referral per Dr Kennon Rounds for patient here.  Notified Joylene John NP, she will review and I will call their office back with appt.

## 2018-07-07 NOTE — Telephone Encounter (Signed)
Called Morey Hummingbird RN back at [facility] clinic with Dr Kennon Rounds for referral appt here, scheduled for 7/22 at 10:15 am, arrive at 10 am- Morey Hummingbird RN will notify patient.

## 2018-07-08 LAB — TYPE AND SCREEN
ABO/RH(D): O POS
Antibody Screen: NEGATIVE
UNIT DIVISION: 0
UNIT DIVISION: 0
UNIT DIVISION: 0
Unit division: 0
Unit division: 0

## 2018-07-08 LAB — BPAM RBC
BLOOD PRODUCT EXPIRATION DATE: 201907212359
BLOOD PRODUCT EXPIRATION DATE: 201907272359
Blood Product Expiration Date: 201907252359
Blood Product Expiration Date: 201907262359
Blood Product Expiration Date: 201907262359
ISSUE DATE / TIME: 201906270529
ISSUE DATE / TIME: 201907102352
ISSUE DATE / TIME: 201907110209
ISSUE DATE / TIME: 201907020259
ISSUE DATE / TIME: 201907102049
UNIT TYPE AND RH: 5100
UNIT TYPE AND RH: 5100
UNIT TYPE AND RH: 9500
Unit Type and Rh: 5100
Unit Type and Rh: 5100

## 2018-07-17 ENCOUNTER — Inpatient Hospital Stay: Payer: Self-pay

## 2018-07-17 ENCOUNTER — Encounter: Payer: Self-pay | Admitting: Gynecologic Oncology

## 2018-07-17 ENCOUNTER — Inpatient Hospital Stay: Payer: Self-pay | Attending: Gynecologic Oncology | Admitting: Gynecologic Oncology

## 2018-07-17 VITALS — BP 138/84 | HR 71 | Temp 98.8°F | Resp 20 | Ht 66.0 in | Wt 248.4 lb

## 2018-07-17 DIAGNOSIS — D250 Submucous leiomyoma of uterus: Secondary | ICD-10-CM

## 2018-07-17 DIAGNOSIS — R9389 Abnormal findings on diagnostic imaging of other specified body structures: Secondary | ICD-10-CM

## 2018-07-17 DIAGNOSIS — D251 Intramural leiomyoma of uterus: Secondary | ICD-10-CM

## 2018-07-17 DIAGNOSIS — D500 Iron deficiency anemia secondary to blood loss (chronic): Secondary | ICD-10-CM

## 2018-07-17 DIAGNOSIS — D259 Leiomyoma of uterus, unspecified: Secondary | ICD-10-CM | POA: Insufficient documentation

## 2018-07-17 DIAGNOSIS — D279 Benign neoplasm of unspecified ovary: Secondary | ICD-10-CM

## 2018-07-17 DIAGNOSIS — D252 Subserosal leiomyoma of uterus: Secondary | ICD-10-CM

## 2018-07-17 MED ORDER — FERROUS GLUCONATE 324 (38 FE) MG PO TABS: 324.0000 mg | ORAL_TABLET | Freq: Three times a day (TID) | ORAL | 3 refills | Status: DC

## 2018-07-17 MED ORDER — LEUPROLIDE ACETATE (3 MONTH) 11.25 MG IM KIT
11.2500 mg | PACK | Freq: Once | INTRAMUSCULAR | Status: AC
  Administered 2018-07-17: 11.25 mg via INTRAMUSCULAR
  Filled 2018-07-17: qty 11.25

## 2018-07-17 MED ORDER — SENNA 8.6 MG PO TABS: 1.0000 | ORAL_TABLET | Freq: Every evening | ORAL | 0 refills | Status: DC | PRN

## 2018-07-17 MED ORDER — LEUPROLIDE ACETATE (3 MONTH) 11.25 MG IM KIT: 11.2500 mg | PACK | Freq: Once | INTRAMUSCULAR | 0 refills | Status: DC

## 2018-07-17 MED ORDER — LORAZEPAM 2 MG/ML IJ SOLN
INTRAMUSCULAR | Status: AC
  Filled 2018-07-17: qty 1

## 2018-07-17 MED ORDER — LEUPROLIDE ACETATE (3 MONTH) 11.25 MG IM KIT: 11.2500 mg | PACK | Freq: Once | INTRAMUSCULAR | Status: DC

## 2018-07-17 NOTE — Patient Instructions (Signed)

## 2018-07-17 NOTE — Progress Notes (Signed)
Lupron administered into left glut.  Patient tolerated well.  10 min post observation complete, patient discharged with no distress.

## 2018-07-17 NOTE — Patient Instructions (Addendum)
Plan on having a lupron injection today.  We will also arrange for you to have an MRI.  Plan on surgery at [facility] with Dr. Janie Morning on August 23, 2018.  Your pre-operative appointment will be at [facility] on August 20 at 11:15am at [facility] in [place].  You will meet Dr. Skeet Latch at that time.  Leuprolide injection What is this medicine? LEUPROLIDE (loo PROE lide) is a man-made hormone. It is used to treat the symptoms of prostate cancer. This medicine may also be used to treat children with early onset of puberty. It may be used for other hormonal conditions. This medicine may be used for other purposes; ask your health care provider or pharmacist if you have questions. COMMON BRAND NAME(S): Lupron What should I tell my health care provider before I take this medicine? They need to know if you have any of these conditions: -diabetes -heart disease or previous heart attack -high blood pressure -high cholesterol -pain or difficulty passing urine -spinal cord metastasis -stroke -tobacco smoker -an unusual or allergic reaction to leuprolide, benzyl alcohol, other medicines, foods, dyes, or preservatives -pregnant or trying to get pregnant -breast-feeding How should I use this medicine? This medicine is for injection under the skin or into a muscle. You will be taught how to prepare and give this medicine. Use exactly as directed. Take your medicine at regular intervals. Do not take your medicine more often than directed. It is important that you put your used needles and syringes in a special sharps container. Do not put them in a trash can. If you do not have a sharps container, call your pharmacist or healthcare provider to get one. A special MedGuide will be given to you by the pharmacist with each prescription and refill. Be sure to read this information carefully each time. Talk to your pediatrician regarding the use of this medicine in children. While this medicine may be prescribed  for children as young as 8 years for selected conditions, precautions do apply. Overdosage: If you think you have taken too much of this medicine contact a poison control center or emergency room at once. NOTE: This medicine is only for you. Do not share this medicine with others. What if I miss a dose? If you miss a dose, take it as soon as you can. If it is almost time for your next dose, take only that dose. Do not take double or extra doses. What may interact with this medicine? Do not take this medicine with any of the following medications: -chasteberry This medicine may also interact with the following medications: -herbal or dietary supplements, like black cohosh or DHEA -female hormones, like estrogens or progestins and birth control pills, patches, rings, or injections -female hormones, like testosterone This list may not describe all possible interactions. Give your health care provider a list of all the medicines, herbs, non-prescription drugs, or dietary supplements you use. Also tell them if you smoke, drink alcohol, or use illegal drugs. Some items may interact with your medicine. What should I watch for while using this medicine? Visit your doctor or health care professional for regular checks on your progress. During the first week, your symptoms may get worse, but then will improve as you continue your treatment. You may get hot flashes, increased bone pain, increased difficulty passing urine, or an aggravation of nerve symptoms. Discuss these effects with your doctor or health care professional, some of them may improve with continued use of this medicine. Female patients  may experience a menstrual cycle or spotting during the first 2 months of therapy with this medicine. If this continues, contact your doctor or health care professional. What side effects may I notice from receiving this medicine? Side effects that you should report to your doctor or health care professional as  soon as possible: -allergic reactions like skin rash, itching or hives, swelling of the face, lips, or tongue -breathing problems -chest pain -depression or memory disorders -pain in your legs or groin -pain at site where injected -severe headache -swelling of the feet and legs -visual changes -vomiting Side effects that usually do not require medical attention (report to your doctor or health care professional if they continue or are bothersome): -breast swelling or tenderness -decrease in sex drive or performance -diarrhea -hot flashes -loss of appetite -muscle, joint, or bone pains -nausea -redness or irritation at site where injected -skin problems or acne This list may not describe all possible side effects. Call your doctor for medical advice about side effects. You may report side effects to FDA at 1-800-FDA-1088. Where should I keep my medicine? Keep out of the reach of children. Store below 25 degrees C (77 degrees F). Do not freeze. Protect from light. Do not use if it is not clear or if there are particles present. Throw away any unused medicine after the expiration date. NOTE: This sheet is a summary. It may not cover all possible information. If you have questions about this medicine, talk to your doctor, pharmacist, or health care provider.  2018 Elsevier/Gold Standard (2016-08-02 10:54:35)

## 2018-07-17 NOTE — Progress Notes (Signed)
Consult Note: Gyn-Onc  Consult was requested by Dr. Kennon Rounds for the evaluation of [name] 52 y.o. female  CC:  Chief Complaint  Patient presents with  . Fibroids    Assessment/Plan:  Ms. MERLINDA WRUBEL  is a 52 y.o.  year old with symptomatic uterine fibroids measuring up to 60cm and causing acute blood loss anemia necessitating transfusion. She is obese (BMI 41.6kg/m2).  We will need to have an MRI pelvis and abdomen to better characterize the full extent of the fibroids and to evaluate for features concerning for sarcoma. MRI abdomen is necessary in addition to pelvis due to the size of the mass which prevents visualization on pelvic imaging alone.  I have prescribed her ferrous gluconate 3 times a day with senna for her severe iron deficiency anemia as this will optimize her hemoglobin prior to a major laparotomy in which high EBL is anticipated. I have prescribed depot lupron 11.5mg  IM x 1 to stop menstruation and attempt to decrease size of the fibroids preoperatively.  Given the high risk for major intraoperative hemorrhage, particularly as pelvic visualization of vascular supply may be difficult given her bulky posterior lower uterine segment fibroid, I am recommending that she undergo surgery at a facility with an inpatient blood bank (such as [facility], [place]). She is accepting of this. We will facilitate a preoperative appointment with my partner, Dr Janie Morning. Surgery will be planned to mid/late August to allow for optimization of preop Hb and shrinkage of fibroids.   I explained that an exploratory laparotomy will be necessary with vertical incision. I explained the significant risks of this procedure including hemorrhage requiring transfusion, damage to adjacent structures (such as GU and GI structures, which is increased in this setting given the potential for limited visualization of such structures), infection, VTE, reoperation, death. I explained that her obesity  increases her risk for the above complications. I explained that postoperatively she should expect to stay in hospital for approximately 2-7 days on average, but longer should a complication develop.   Surgery has been scheduled as a total abdominal hysterectomy >250gm. Of note, her fallopian tubes have already been removed as part of treatment of ectopic pregnancies. We have requested that a cell-saver be available for her surgery.  HPI: Ms Rebeka Kimble 52 year old P1 who is seen in consultation at the request of Dr Kennon Rounds for a large fibroid uterus, symptomatic with menorrhagia, mass effect and iron deficiency anemia requiring transfusion.  Ms. Micaella was noted to have a fibroid uterus in 2011.  At that time she was experiencing menorrhagia.  Work-up which included an ultrasound revealed a 10 cm uterine mass with multiple small fibroids including a cervical fibroid.  She went to the operating room in 2011 and underwent a D&C and removal of cervical fibroid.  Pathology from that surgery was benign in both the endometrium and endocervical myoma.  Following that procedure she had less bleeding.  She has been on no suppressant medication such as birth control pills, Lupron, or Depo-Provera.  In January 2019 she began experiencing menorrhagia again and intermenstrual bleeding.  Hemoglobin on June 19, 2018 was 6.1.  It was repeated on July 05, 2018 was 5.8.  As she was very symptomatic with fatigue and weakness she was admitted to [facility] hospital and received 2 units packed cell blood transfusion rising her hemoglobin to 8.7 on July 06, 2018.  During that hospital admission she was taken to the operating room for an examination  under anesthesia and attempt at endometrial sampling.  Unfortunately due to a massively enlarged uterus and posterior cervical fibroid that was displacing the cervix anteriorly, visualization of the cervix was not possible nor was endometrial sampling.  A transvaginal and abdominal  ultrasound which was performed on June 23, 2018 revealed a uterus, at least and probably greater than 60.9 x 10.7 x 11.2 cm.  There is a large fundal fibroid which measures 10.5 cm x 9.1 x 12 cm.  There is a fibroid near the cervix measuring 7.2 and 9.2 x 6.4 cm and a posterior fibroid which measures 3.4 x 2.5 x 3.4 cm.  An additional posterior fibroid measures 4.9 cm in greatest dimension.  The endometrium was difficult to visualize but estimated at 28 mm.  The ovaries were not able to be visualized.  The patient otherwise has a surgical history significant for 2 laparotomies for ectopic pregnancies in approximately 2000 and 2005.  Prior to that she had had one prior vaginal delivery.  Her medical history is significant for an episode of diverticulitis treated medically.  She has some untreated hypertension which is mild.  She is never been checked for diabetes.  She is morbidly obese with a BMI of 42 kilograms per meter squared. She denies a history of abnormal pap smears.  Current Meds:  Outpatient Encounter Medications as of 07/17/2018  Medication Sig  . ibuprofen (ADVIL,MOTRIN) 600 MG tablet Take 1 tablet (600 mg total) by mouth every 6 (six) hours as needed (mild pain).  . megestrol (MEGACE) 40 MG tablet Take 1 tablet (40 mg total) by mouth 2 (two) times daily. Can increase to two tablets twice a day in the event of heavy bleeding   No facility-administered encounter medications on file as of 07/17/2018.     Allergy:  Allergies  Allergen Reactions  . Amoxicillin Rash    Has patient had a PCN reaction causing immediate rash, facial/tongue/throat swelling, SOB or lightheadedness with hypotension: No Has patient had a PCN reaction causing severe rash involving mucus membranes or skin necrosis: No Has patient had a PCN reaction that required hospitalization: No Has patient had a PCN reaction occurring within the last 10 years: No If all of the above answers are "NO", then may proceed with  Cephalosporin use.     Social Hx:   Social History   Socioeconomic History  . Marital status: Single    Spouse name: Not on file  . Number of children: Not on file  . Years of education: Not on file  . Highest education level: Not on file  Occupational History  . Not on file  Social Needs  . Financial resource strain: Not on file  . Food insecurity:    Worry: Not on file    Inability: Not on file  . Transportation needs:    Medical: Not on file    Non-medical: Not on file  Tobacco Use  . Smoking status: Never Smoker  . Smokeless tobacco: Never Used  Substance and Sexual Activity  . Alcohol use: No  . Drug use: No  . Sexual activity: Yes  Lifestyle  . Physical activity:    Days per week: Not on file    Minutes per session: Not on file  . Stress: Not on file  Relationships  . Social connections:    Talks on phone: Not on file    Gets together: Not on file    Attends religious service: Not on file    Active member of  club or organization: Not on file    Attends meetings of clubs or organizations: Not on file    Relationship status: Not on file  . Intimate partner violence:    Fear of current or ex partner: Not on file    Emotionally abused: Not on file    Physically abused: Not on file    Forced sexual activity: Not on file  Other Topics Concern  . Not on file  Social History Narrative  . Not on file    Past Surgical Hx:  Past Surgical History:  Procedure Laterality Date  . DILATION AND CURETTAGE OF UTERUS N/A 07/06/2018   Procedure: VAGINAL MUCOSA LACERATION REPAIR;  Surgeon: Chancy Milroy, MD;  Location: [facility];  Service: Gynecology;  Laterality: N/A;  . ECTOPIC PREGNANCY SURGERY     two pregnancy  . SALPINGECTOMY    . UTERINE FIBROID SURGERY  08/12/2010   diagnositic hysterscopy with D&C and removal of central fibroid.     Past Medical Hx:  Past Medical History:  Diagnosis Date  . Fibroids     Past Gynecological History:  See HPI No LMP  recorded (lmp unknown).  Family Hx:  Family History  Problem Relation Age of Onset  . Diabetes Mother   . Hypertension Mother   . Cancer Father        colon  . Diabetes Father   . Hypertension Father   . Diabetes Sister   . Hypertension Sister   . Cancer Maternal Grandmother     Review of Systems:  Constitutional  Feels well,    ENT Normal appearing ears and nares bilaterally Skin/Breast  No rash, sores, jaundice, itching, dryness Cardiovascular  No chest pain, shortness of breath, or edema  Pulmonary  No cough or wheeze.  Gastro Intestinal  No nausea, vomitting, or diarrhoea. No bright red blood per rectum, no abdominal pain, change in bowel movement, or constipation.  Genito Urinary  No frequency, urgency, dysuria, + menorrhagia Musculo Skeletal  No myalgia, arthralgia, joint swelling or pain  Neurologic  No weakness, numbness, change in gait,  Psychology  No depression, anxiety, insomnia.   Vitals:  There were no vitals taken for this visit.  Physical Exam: WD in NAD Neck  Supple NROM, without any enlargements.  Lymph Node Survey No cervical supraclavicular or inguinal adenopathy Cardiovascular  Pulse normal rate, regularity and rhythm. S1 and S2 normal.  Lungs  Clear to auscultation bilateraly, without wheezes/crackles/rhonchi. Good air movement.  Skin  No rash/lesions/breakdown  Psychiatry  Alert and oriented to person, place, and time  Abdomen  Normoactive bowel sounds, abdomen soft, non-tender and obese without evidence of hernia. The fibroid mass is palpable in the upper abdomen (above the level of the umbilicus) but well below the xiphoid.  Back No CVA tenderness Genito Urinary  Vulva/vagina: Normal external female genitalia.  No lesions. No discharge or bleeding.  Bladder/urethra:  No lesions or masses, well supported bladder  Vagina: normal  Cervix: Displaced anteriorally, palpable posterior cervical myoma which fills the posterior pelvis and  rotates the cervix anteriorally behind the pubic symphysis.   Uterus: Massive 10cm+ posterior cervical fibroid filling posterior pelvis, minimal mobility, no parametrial involvement or nodularity.  Adnexa: no discrete masses other than massively enlarged uterus. Rectal  Good tone, no masses no cul de sac nodularity. Smooth wall to fibroid. Extremities  No bilateral cyanosis, clubbing or edema.   Thereasa Solo, MD  07/17/2018, 10:17 AM

## 2018-07-19 ENCOUNTER — Telehealth: Payer: Self-pay | Admitting: *Deleted

## 2018-07-19 NOTE — Telephone Encounter (Signed)
The patient called in to ask about the Lupron Injection.  Patient states that "I  received the Lupron injection her at the cancer center in the infusion room on Monday".  " My local pharmacist at [facility] ask me about the prescription and I thought that I was only suppose to have it one time, so I am calling to see what I need to do".  I explained to the patient that she doesn't need to do anything further at this time, and that our office would call [facility] and cancel the prescription.  I called [facility] on the corner of [address] and spoke with Cheral Bay the pharmacist and he states " I cancelled the prescription but [facility] probably has already called Mrs. Huckaba about the prescription.  Cheral Bay gave me the number to the [facility] at [phone]. I spoke with the pharmacy tech Laurey Morale and she cancelled the prescription.  I told the patient that we cancelled the order, patient was very appreciative and verbalized understanding.

## 2018-07-20 ENCOUNTER — Inpatient Hospital Stay (HOSPITAL_COMMUNITY): Admission: RE | Admit: 2018-07-20 | Payer: Self-pay | Source: Ambulatory Visit

## 2018-07-24 ENCOUNTER — Ambulatory Visit (HOSPITAL_COMMUNITY)
Admission: RE | Admit: 2018-07-24 | Discharge: 2018-07-24 | Disposition: A | Discharge status: Home / Self Care | Payer: Self-pay | Source: Ambulatory Visit | Attending: Gynecologic Oncology | Admitting: Gynecologic Oncology

## 2018-07-24 ENCOUNTER — Ambulatory Visit (HOSPITAL_COMMUNITY): Payer: Self-pay

## 2018-07-24 ENCOUNTER — Telehealth: Payer: Self-pay

## 2018-07-24 ENCOUNTER — Other Ambulatory Visit: Payer: Self-pay | Admitting: Gynecologic Oncology

## 2018-07-24 DIAGNOSIS — D250 Submucous leiomyoma of uterus: Secondary | ICD-10-CM

## 2018-07-24 DIAGNOSIS — D251 Intramural leiomyoma of uterus: Principal | ICD-10-CM

## 2018-07-24 DIAGNOSIS — F40240 Claustrophobia: Secondary | ICD-10-CM

## 2018-07-24 DIAGNOSIS — D252 Subserosal leiomyoma of uterus: Principal | ICD-10-CM

## 2018-07-24 MED ORDER — LORAZEPAM 0.5 MG PO TABS: 0.5000 mg | ORAL_TABLET | Freq: Once | ORAL | 0 refills | Status: AC

## 2018-07-24 NOTE — Telephone Encounter (Signed)
Told Christina Stanley that the MRI will be on 08-01-18 at [facility] at 1 pm  She needs to register in radiology at 1230 pm. Nothing to eat or drink 4 hrs prior to scan = 0900. Pt will not be driving to appointment. Will send in Ativan  0.5 mg to [facility] to take 1 hr prior to the scan. Pt verbalized understanding.

## 2018-07-24 NOTE — Progress Notes (Signed)
Attempted patient for MRI, patient was unable to tolerate exam due to claustrophobia and body habitus. Patient needs to be scanned in Wide Bore scanner at [facility] with Anti Anxiety medication. Patient was given education instructions about MRI and process to get scheduled at [facility]. She can be reached at [phone]

## 2018-07-24 NOTE — Progress Notes (Signed)
Ativan to be taken prior to MRI.  Pt advised by RN to have a driver and to not take and drive.

## 2018-07-26 ENCOUNTER — Telehealth: Payer: Self-pay

## 2018-07-26 NOTE — Telephone Encounter (Signed)
Christina Stanley is going to see the dentist for an abscessed tooth.  Can she take any antibiotics with the Lupron injection she took on 07-17-18 or the megace she is taking? Reviewed with Christina John, NP.  She said that any ATB would be fine to take.  The tooth needs to be extracted prior to her surgery on 08-23-18 at [facility]. Pt will be on the ATB ~ 1 week prior to extraction.  Requested Christina Stanley to let this office know if the extraction is delayed for any reason as ;her surgery may need to be delayed. Pt verbalized understanding.

## 2018-08-01 ENCOUNTER — Encounter (HOSPITAL_COMMUNITY): Payer: Self-pay

## 2018-08-01 ENCOUNTER — Other Ambulatory Visit: Payer: Self-pay | Admitting: Gynecologic Oncology

## 2018-08-01 ENCOUNTER — Ambulatory Visit (HOSPITAL_COMMUNITY)
Admission: RE | Admit: 2018-08-01 | Discharge: 2018-08-01 | Disposition: A | Discharge status: Home / Self Care | Payer: Self-pay | Source: Ambulatory Visit | Attending: Gynecologic Oncology | Admitting: Gynecologic Oncology

## 2018-08-01 ENCOUNTER — Ambulatory Visit (HOSPITAL_COMMUNITY): Admission: RE | Admit: 2018-08-01 | Payer: Self-pay | Source: Ambulatory Visit

## 2018-08-01 ENCOUNTER — Ambulatory Visit (HOSPITAL_COMMUNITY): Payer: Self-pay

## 2018-08-01 DIAGNOSIS — D219 Benign neoplasm of connective and other soft tissue, unspecified: Secondary | ICD-10-CM

## 2018-08-01 DIAGNOSIS — D259 Leiomyoma of uterus, unspecified: Secondary | ICD-10-CM | POA: Insufficient documentation

## 2018-08-01 MED ORDER — GADOBENATE DIMEGLUMINE 529 MG/ML IV SOLN
20.0000 mL | Freq: Once | INTRAVENOUS | Status: AC | PRN
  Administered 2018-08-01: 20 mL via INTRAVENOUS

## 2018-08-07 ENCOUNTER — Telehealth: Payer: Self-pay

## 2018-08-07 NOTE — Telephone Encounter (Signed)
Told Christina Stanley that she has an appointment on 08-15-18 at 30 am in [place] for her pre op appointment and Surgery is 08-23-18. Gave her Dr. Leone Brand office number in [place] to discuss when she needs to arrive for pre op and where to go. [phone]. Pt verbalized understanding.

## 2018-08-14 ENCOUNTER — Telehealth: Payer: Self-pay

## 2018-08-14 NOTE — Telephone Encounter (Signed)
Christina Stanley is experiencing lower back pain, pain and burning with urination.  Surgery is 08-23-18 at [facility] with Dr. Skeet Latch. Pt has her pre-op appointment tomorrow.  She can tell the nurse her symptoms and a UA/C&S can be done to evaluate her for a UTI.  Pt verbalized understanding.

## 2018-08-21 ENCOUNTER — Ambulatory Visit (HOSPITAL_COMMUNITY)
Admission: EM | Admit: 2018-08-21 | Discharge: 2018-08-21 | Disposition: A | Discharge status: Home / Self Care | Payer: Self-pay | Attending: Family Medicine | Admitting: Family Medicine

## 2018-08-21 ENCOUNTER — Telehealth: Payer: Self-pay | Admitting: Family Medicine

## 2018-08-21 NOTE — Telephone Encounter (Signed)
Called patient stating I am trying to reach you to return your phone call. Patient states she is scheduled to have a hysterectomy at [facility] on Wednesday and went for pre-op today and was told her blood pressure was high with systolic 161W. Patient states she was told to contact her doctor for BP medication. Patient states she only sees Dr Kennon Rounds. Asked patient if she has ever been on BP medication before & she states no. Discussed with patient Dr Kennon Rounds isn't in the office this week and her BP in our office has been relatively normal. Recommended she go to a [facility] or Urgent care. Discussed [place] will also continue to monitor her BP at her appt the end of September. Patient verbalized understanding to all & had no questions.

## 2018-08-21 NOTE — Telephone Encounter (Signed)
Patient called requesting to speak with Christina Stanley. She stated she was going to have surgery, and needed to get on some blood pressure medication. She also stated she did not have a PCP. I gave her the information for the [facility]. She stated she would call them, but still wanted someone from the office to call so she can see If Dr Kennon Stanley will give her a Rx.

## 2018-08-23 HISTORY — PX: TOTAL ABDOMINAL HYSTERECTOMY W/ BILATERAL SALPINGOOPHORECTOMY: SHX83

## 2018-08-26 MED ORDER — GENERIC EXTERNAL MEDICATION
2.00 | Status: DC
Start: 2018-08-26 — End: 2018-08-26

## 2018-08-26 MED ORDER — GENERIC EXTERNAL MEDICATION
1.00 | Status: DC
Start: ? — End: 2018-08-26

## 2018-08-26 MED ORDER — OXYCODONE HCL 5 MG PO TABS
5.00 | ORAL_TABLET | ORAL | Status: DC
Start: ? — End: 2018-08-26

## 2018-08-26 MED ORDER — MORPHINE SULFATE 4 MG/ML IJ SOLN
2.00 | INTRAMUSCULAR | Status: DC
Start: ? — End: 2018-08-26

## 2018-08-26 MED ORDER — POLYETHYLENE GLYCOL 3350 17 G PO PACK
17.00 | PACK | ORAL | Status: DC
Start: ? — End: 2018-08-26

## 2018-08-26 MED ORDER — SIMETHICONE 80 MG PO CHEW
80.00 | CHEWABLE_TABLET | ORAL | Status: DC
Start: ? — End: 2018-08-26

## 2018-08-26 MED ORDER — ALUM & MAG HYDROXIDE-SIMETH 400-400-40 MG/5ML PO SUSP
30.00 | ORAL | Status: DC
Start: ? — End: 2018-08-26

## 2018-08-26 MED ORDER — FERROUS GLUCONATE 324 (38 FE) MG PO TABS
324.00 | ORAL_TABLET | ORAL | Status: DC
Start: 2018-08-26 — End: 2018-08-26

## 2018-08-26 MED ORDER — ONDANSETRON 4 MG PO TBDP
4.00 | ORAL_TABLET | ORAL | Status: DC
Start: ? — End: 2018-08-26

## 2018-08-26 MED ORDER — IBUPROFEN 600 MG PO TABS
600.00 | ORAL_TABLET | ORAL | Status: DC
Start: 2018-08-26 — End: 2018-08-26

## 2018-08-26 MED ORDER — ACETAMINOPHEN 325 MG PO TABS
650.00 | ORAL_TABLET | ORAL | Status: DC
Start: 2018-08-26 — End: 2018-08-26

## 2018-08-26 MED ORDER — DOCUSATE SODIUM 100 MG PO CAPS
100.00 | ORAL_CAPSULE | ORAL | Status: DC
Start: 2018-08-26 — End: 2018-08-26

## 2018-08-26 MED ORDER — ENOXAPARIN SODIUM 40 MG/0.4ML ~~LOC~~ SOLN
40.00 | SUBCUTANEOUS | Status: DC
Start: 2018-08-27 — End: 2018-08-26

## 2018-09-20 ENCOUNTER — Other Ambulatory Visit: Payer: Self-pay

## 2018-09-20 ENCOUNTER — Encounter (INDEPENDENT_AMBULATORY_CARE_PROVIDER_SITE_OTHER): Payer: Self-pay | Admitting: Physician Assistant

## 2018-09-20 ENCOUNTER — Ambulatory Visit (INDEPENDENT_AMBULATORY_CARE_PROVIDER_SITE_OTHER): Payer: Self-pay | Admitting: Physician Assistant

## 2018-09-20 VITALS — BP 132/88 | HR 71 | Temp 97.7°F | Ht 66.0 in | Wt 235.8 lb

## 2018-09-20 DIAGNOSIS — Z7689 Persons encountering health services in other specified circumstances: Secondary | ICD-10-CM

## 2018-09-20 DIAGNOSIS — Z1211 Encounter for screening for malignant neoplasm of colon: Secondary | ICD-10-CM

## 2018-09-20 DIAGNOSIS — I10 Essential (primary) hypertension: Secondary | ICD-10-CM

## 2018-09-20 MED ORDER — AMLODIPINE BESYLATE 5 MG PO TABS: 5.0000 mg | ORAL_TABLET | Freq: Every day | ORAL | 1 refills | Status: DC

## 2018-09-20 NOTE — Patient Instructions (Signed)
DASH Eating Plan DASH stands for "Dietary Approaches to Stop Hypertension." The DASH eating plan is a healthy eating plan that has been shown to reduce high blood pressure (hypertension). It may also reduce your risk for type 2 diabetes, heart disease, and stroke. The DASH eating plan may also help with weight loss. What are tips for following this plan? General guidelines  Avoid eating more than 2,300 mg (milligrams) of salt (sodium) a day. If you have hypertension, you may need to reduce your sodium intake to 1,500 mg a day.  Limit alcohol intake to no more than 1 drink a day for nonpregnant women and 2 drinks a day for men. One drink equals 12 oz of beer, 5 oz of wine, or 1 oz of hard liquor.  Work with your health care provider to maintain a healthy body weight or to lose weight. Ask what an ideal weight is for you.  Get at least 30 minutes of exercise that causes your heart to beat faster (aerobic exercise) most days of the week. Activities may include walking, swimming, or biking.  Work with your health care provider or diet and nutrition specialist (dietitian) to adjust your eating plan to your individual calorie needs. Reading food labels  Check food labels for the amount of sodium per serving. Choose foods with less than 5 percent of the Daily Value of sodium. Generally, foods with less than 300 mg of sodium per serving fit into this eating plan.  To find whole grains, look for the word "whole" as the first word in the ingredient list. Shopping  Buy products labeled as "low-sodium" or "no salt added."  Buy fresh foods. Avoid canned foods and premade or frozen meals. Cooking  Avoid adding salt when cooking. Use salt-free seasonings or herbs instead of table salt or sea salt. Check with your health care provider or pharmacist before using salt substitutes.  Do not fry foods. Cook foods using healthy methods such as baking, boiling, grilling, and broiling instead.  Cook with  heart-healthy oils, such as olive, canola, soybean, or sunflower oil. Meal planning   Eat a balanced diet that includes: ? 5 or more servings of fruits and vegetables each day. At each meal, try to fill half of your plate with fruits and vegetables. ? Up to 6-8 servings of whole grains each day. ? Less than 6 oz of lean meat, poultry, or fish each day. A 3-oz serving of meat is about the same size as a deck of cards. One egg equals 1 oz. ? 2 servings of low-fat dairy each day. ? A serving of nuts, seeds, or beans 5 times each week. ? Heart-healthy fats. Healthy fats called Omega-3 fatty acids are found in foods such as flaxseeds and coldwater fish, like sardines, salmon, and mackerel.  Limit how much you eat of the following: ? Canned or prepackaged foods. ? Food that is high in trans fat, such as fried foods. ? Food that is high in saturated fat, such as fatty meat. ? Sweets, desserts, sugary drinks, and other foods with added sugar. ? Full-fat dairy products.  Do not salt foods before eating.  Try to eat at least 2 vegetarian meals each week.  Eat more home-cooked food and less restaurant, buffet, and fast food.  When eating at a restaurant, ask that your food be prepared with less salt or no salt, if possible. What foods are recommended? The items listed may not be a complete list. Talk with your dietitian about what   dietary choices are best for you. Grains Whole-grain or whole-wheat bread. Whole-grain or whole-wheat pasta. Brown rice. Oatmeal. Quinoa. Bulgur. Whole-grain and low-sodium cereals. Pita bread. Low-fat, low-sodium crackers. Whole-wheat flour tortillas. Vegetables Fresh or frozen vegetables (raw, steamed, roasted, or grilled). Low-sodium or reduced-sodium tomato and vegetable juice. Low-sodium or reduced-sodium tomato sauce and tomato paste. Low-sodium or reduced-sodium canned vegetables. Fruits All fresh, dried, or frozen fruit. Canned fruit in natural juice (without  added sugar). Meat and other protein foods Skinless chicken or turkey. Ground chicken or turkey. Pork with fat trimmed off. Fish and seafood. Egg whites. Dried beans, peas, or lentils. Unsalted nuts, nut butters, and seeds. Unsalted canned beans. Lean cuts of beef with fat trimmed off. Low-sodium, lean deli meat. Dairy Low-fat (1%) or fat-free (skim) milk. Fat-free, low-fat, or reduced-fat cheeses. Nonfat, low-sodium ricotta or cottage cheese. Low-fat or nonfat yogurt. Low-fat, low-sodium cheese. Fats and oils Soft margarine without trans fats. Vegetable oil. Low-fat, reduced-fat, or light mayonnaise and salad dressings (reduced-sodium). Canola, safflower, olive, soybean, and sunflower oils. Avocado. Seasoning and other foods Herbs. Spices. Seasoning mixes without salt. Unsalted popcorn and pretzels. Fat-free sweets. What foods are not recommended? The items listed may not be a complete list. Talk with your dietitian about what dietary choices are best for you. Grains Baked goods made with fat, such as croissants, muffins, or some breads. Dry pasta or rice meal packs. Vegetables Creamed or fried vegetables. Vegetables in a cheese sauce. Regular canned vegetables (not low-sodium or reduced-sodium). Regular canned tomato sauce and paste (not low-sodium or reduced-sodium). Regular tomato and vegetable juice (not low-sodium or reduced-sodium). Pickles. Olives. Fruits Canned fruit in a light or heavy syrup. Fried fruit. Fruit in cream or butter sauce. Meat and other protein foods Fatty cuts of meat. Ribs. Fried meat. Bacon. Sausage. Bologna and other processed lunch meats. Salami. Fatback. Hotdogs. Bratwurst. Salted nuts and seeds. Canned beans with added salt. Canned or smoked fish. Whole eggs or egg yolks. Chicken or turkey with skin. Dairy Whole or 2% milk, cream, and half-and-half. Whole or full-fat cream cheese. Whole-fat or sweetened yogurt. Full-fat cheese. Nondairy creamers. Whipped toppings.  Processed cheese and cheese spreads. Fats and oils Butter. Stick margarine. Lard. Shortening. Ghee. Bacon fat. Tropical oils, such as coconut, palm kernel, or palm oil. Seasoning and other foods Salted popcorn and pretzels. Onion salt, garlic salt, seasoned salt, table salt, and sea salt. Worcestershire sauce. Tartar sauce. Barbecue sauce. Teriyaki sauce. Soy sauce, including reduced-sodium. Steak sauce. Canned and packaged gravies. Fish sauce. Oyster sauce. Cocktail sauce. Horseradish that you find on the shelf. Ketchup. Mustard. Meat flavorings and tenderizers. Bouillon cubes. Hot sauce and Tabasco sauce. Premade or packaged marinades. Premade or packaged taco seasonings. Relishes. Regular salad dressings. Where to find more information:  National Heart, Lung, and Blood Institute: www.nhlbi.nih.gov  American Heart Association: www.heart.org Summary  The DASH eating plan is a healthy eating plan that has been shown to reduce high blood pressure (hypertension). It may also reduce your risk for type 2 diabetes, heart disease, and stroke.  With the DASH eating plan, you should limit salt (sodium) intake to 2,300 mg a day. If you have hypertension, you may need to reduce your sodium intake to 1,500 mg a day.  When on the DASH eating plan, aim to eat more fresh fruits and vegetables, whole grains, lean proteins, low-fat dairy, and heart-healthy fats.  Work with your health care provider or diet and nutrition specialist (dietitian) to adjust your eating plan to your individual   calorie needs. This information is not intended to replace advice given to you by your health care provider. Make sure you discuss any questions you have with your health care provider. Document Released: 12/02/2011 Document Revised: 12/06/2016 Document Reviewed: 12/06/2016 Elsevier Interactive Patient Education  2018 [employer]  

## 2018-09-20 NOTE — Progress Notes (Signed)
Subjective:  Patient ID: Christina Stanley, female    DOB: 07-17-1966  Age: 52 y.o. MRN: [id number]  CC:   HPI Christina Stanley is a 52 y.o. female with a medical history of hysterectomy 08/15/18 presents as a new patient to discuss her BP. Says that she was told her blood pressure was high and needed to be controlled. However, last two BP readings have been 138/84 mmHg on 07/17/18 and 132/88 mmHg today. Says she measures her BP at home and finds readings to be in the 120s/80s but acknowledges that her BP monitor is very old. Does not endorse CP, palpitations, SOB, HA, tingling, numbness, weakness, abdominal pain, f/c/n/v, rash, swelling, or GI/GU sxs.        Outpatient Medications Prior to Visit  Medication Sig Dispense Refill  . docusate sodium (COLACE) 100 MG capsule Take 100 mg by mouth 2 (two) times daily.    . ferrous gluconate (FERGON) 324 MG tablet Take 1 tablet (324 mg total) by mouth 3 (three) times daily with meals. 100 tablet 3  . ibuprofen (ADVIL,MOTRIN) 600 MG tablet Take 1 tablet (600 mg total) by mouth every 6 (six) hours as needed (mild pain). 30 tablet 0  . polyethylene glycol (MIRALAX / GLYCOLAX) packet Take by mouth.    . senna (SENOKOT) 8.6 MG TABS tablet Take 1 tablet (8.6 mg total) by mouth at bedtime as needed for mild constipation. 120 each 0  . ferrous sulfate 325 (65 FE) MG tablet Take 325 mg by mouth daily with breakfast.    . megestrol (MEGACE) 40 MG tablet Take 1 tablet (40 mg total) by mouth 2 (two) times daily. Can increase to two tablets twice a day in the event of heavy bleeding 60 tablet 5   No facility-administered medications prior to visit.      ROS Review of Systems  Constitutional: Negative for chills, fever and malaise/fatigue.  Eyes: Negative for blurred vision.  Respiratory: Negative for shortness of breath.   Cardiovascular: Negative for chest pain and palpitations.  Gastrointestinal: Negative for abdominal pain and nausea.  Genitourinary: Negative  for dysuria and hematuria.  Musculoskeletal: Negative for joint pain and myalgias.  Skin: Negative for rash.  Neurological: Negative for tingling and headaches.  Psychiatric/Behavioral: Negative for depression. The patient is not nervous/anxious.     Objective:  BP 132/88 (BP Location: Left Arm, Patient Position: Sitting, Cuff Size: Large)   Pulse 71   Temp 97.7 F (36.5 C) (Oral)   Ht 5\' 6"  (1.676 m)   Wt 235 lb 12.8 oz (107 kg)   LMP  (LMP Unknown)   SpO2 100%   BMI 38.06 kg/m   BP/Weight 09/20/2018 07/17/2018 3/71/6967  Systolic BP 893 810 175  Diastolic BP 88 84 85  Wt. (Lbs) 235.8 248.4 249  BMI 38.06 40.09 -      Physical Exam  Constitutional: She is oriented to person, place, and time.  Well developed, well nourished, NAD, polite  HENT:  Head: Normocephalic and atraumatic.  Eyes: No scleral icterus.  Neck: Normal range of motion. Neck supple. No thyromegaly present.  Cardiovascular: Normal rate, regular rhythm and normal heart sounds.  No LE edema bilaterally. No carotid bruits bilaterally  Pulmonary/Chest: Effort normal and breath sounds normal.  Abdominal: Soft. Bowel sounds are normal. There is no tenderness.  Musculoskeletal: She exhibits no edema.  Neurological: She is alert and oriented to person, place, and time.  Skin: Skin is warm and dry. No rash noted. No erythema.  No pallor.  Psychiatric: She has a normal mood and affect. Her behavior is normal. Thought content normal.  Vitals reviewed.    Assessment & Plan:    1. Encounter to establish care  2. Hypertension, unspecified type - Begin amLODipine (NORVASC) 5 MG tablet; Take 1 tablet (5 mg total) by mouth daily.  Dispense: 90 tablet; Refill: 1  3. Screening for colon cancer - Fecal occult blood, imunochemical; Future   Meds ordered this encounter  Medications  . amLODipine (NORVASC) 5 MG tablet    Sig: Take 1 tablet (5 mg total) by mouth daily.    Dispense:  90 tablet    Refill:  1     Order Specific Question:   Supervising Provider    Answer:   Christina Stanley [[id number]]    Follow-up: Return in about 6 weeks (around 11/01/2018) for HTN.   Clent Demark PA

## 2018-09-22 ENCOUNTER — Telehealth (INDEPENDENT_AMBULATORY_CARE_PROVIDER_SITE_OTHER): Payer: Self-pay

## 2018-09-22 NOTE — Telephone Encounter (Signed)
-----   Message from Clent Demark, PA-C sent at 09/21/2018  6:47 PM EDT ----- Please tell patient that she should show up to her appointment or she will be ineligible for [facility] in the future.     ----- Message ----- From: Armond Hang, LPN Sent: 7/78/2423   7:12 AM EDT To: Clent Demark, PA-C  This patient has missed 2 previous appointments with [facility]. We usually only give patients 2 chances with [facility] due to the high volume of patients that we see. I will reach out to her one more time but if she no shows one more time she will no longer be eligible for out program.  Thanks, Gabriel Cirri ----- Message ----- From: Clent Demark, PA-C Sent: 09/20/2018   2:43 PM EDT To: Armond Hang, LPN  Hello, could you reach out to this patient to have her screened for breast cancer.

## 2018-09-22 NOTE — Telephone Encounter (Signed)
Called patient but received error message that call could not be completed at this time, please hang up try the call again later. Will attempt to call patient once more. Christina Stanley

## 2018-09-26 ENCOUNTER — Telehealth (INDEPENDENT_AMBULATORY_CARE_PROVIDER_SITE_OTHER): Payer: Self-pay

## 2018-09-26 NOTE — Telephone Encounter (Signed)
-----   Message from Clent Demark, PA-C sent at 09/21/2018  6:47 PM EDT ----- Please tell patient that she should show up to her appointment or she will be ineligible for [facility] in the future.     ----- Message ----- From: Armond Hang, LPN Sent: 4/80/1655   7:12 AM EDT To: Clent Demark, PA-C  This patient has missed 2 previous appointments with [facility]. We usually only give patients 2 chances with [facility] due to the high volume of patients that we see. I will reach out to her one more time but if she no shows one more time she will no longer be eligible for out program.  Thanks, Gabriel Cirri ----- Message ----- From: Clent Demark, PA-C Sent: 09/20/2018   2:43 PM EDT To: Armond Hang, LPN  Hello, could you reach out to this patient to have her screened for breast cancer.

## 2018-09-26 NOTE — Telephone Encounter (Signed)
Left message informing patient that [facility] has reached out to our office in regards to her already missing two scheduled appointments with them. They typically only allow two missed appointments, but they are willing to schedule one more and if she does not show she will no longer be eligible for [facility]. Asked patient to return call to [facility] with any questions or concerns. Nat Christen, CMA

## 2018-10-13 ENCOUNTER — Other Ambulatory Visit (HOSPITAL_COMMUNITY): Payer: Self-pay | Admitting: *Deleted

## 2018-10-13 DIAGNOSIS — Z1231 Encounter for screening mammogram for malignant neoplasm of breast: Secondary | ICD-10-CM

## 2018-11-01 ENCOUNTER — Ambulatory Visit (INDEPENDENT_AMBULATORY_CARE_PROVIDER_SITE_OTHER): Payer: Self-pay | Admitting: Physician Assistant

## 2018-11-16 ENCOUNTER — Ambulatory Visit (INDEPENDENT_AMBULATORY_CARE_PROVIDER_SITE_OTHER): Payer: Self-pay | Admitting: Physician Assistant

## 2018-12-14 ENCOUNTER — Ambulatory Visit (INDEPENDENT_AMBULATORY_CARE_PROVIDER_SITE_OTHER): Payer: Self-pay | Admitting: Physician Assistant

## 2018-12-14 ENCOUNTER — Encounter (INDEPENDENT_AMBULATORY_CARE_PROVIDER_SITE_OTHER): Payer: Self-pay | Admitting: Physician Assistant

## 2018-12-14 ENCOUNTER — Other Ambulatory Visit: Payer: Self-pay

## 2018-12-14 VITALS — BP 164/102 | HR 60 | Temp 97.6°F | Ht 66.0 in | Wt 243.6 lb

## 2018-12-14 DIAGNOSIS — D649 Anemia, unspecified: Secondary | ICD-10-CM

## 2018-12-14 DIAGNOSIS — Z131 Encounter for screening for diabetes mellitus: Secondary | ICD-10-CM

## 2018-12-14 DIAGNOSIS — I10 Essential (primary) hypertension: Secondary | ICD-10-CM

## 2018-12-14 DIAGNOSIS — K219 Gastro-esophageal reflux disease without esophagitis: Secondary | ICD-10-CM

## 2018-12-14 DIAGNOSIS — F411 Generalized anxiety disorder: Secondary | ICD-10-CM

## 2018-12-14 DIAGNOSIS — R232 Flushing: Secondary | ICD-10-CM

## 2018-12-14 DIAGNOSIS — R61 Generalized hyperhidrosis: Secondary | ICD-10-CM

## 2018-12-14 DIAGNOSIS — Z90710 Acquired absence of both cervix and uterus: Secondary | ICD-10-CM

## 2018-12-14 LAB — POCT GLYCOSYLATED HEMOGLOBIN (HGB A1C): Hemoglobin A1C: 5.6 % (ref 4.0–5.6)

## 2018-12-14 MED ORDER — SERTRALINE HCL 25 MG PO TABS: 25.0000 mg | ORAL_TABLET | Freq: Every day | ORAL | 1 refills | Status: DC

## 2018-12-14 MED ORDER — OMEPRAZOLE 40 MG PO CPDR: 40.0000 mg | DELAYED_RELEASE_CAPSULE | Freq: Every day | ORAL | 3 refills | Status: DC

## 2018-12-14 MED ORDER — HYDROCHLOROTHIAZIDE 12.5 MG PO TABS: 12.5000 mg | ORAL_TABLET | Freq: Every day | ORAL | 2 refills | Status: DC

## 2018-12-14 NOTE — Progress Notes (Signed)
Subjective:  Patient ID: Christina Stanley, female    DOB: 1966/02/11  Age: 52 y.o. MRN: [id number]  CC: f/u HTN  HPI Christina Stanley is a 52 y.o. female with a medical history of GERD and hysterectomy presents to f/u on HTN. Last BP 132/88 mmHg nearly two months ago. Prescribed amlodipine 5 mg and took one pill. Says amlodipine caused her blood pressure to increase to 200s/90s. Had to call paramedics and BP eventually decreased to 160s. Says prior to taking amlodipine, her BP was usually in the 130s.     Also complains of having a "knot" in her lower throat at the level of the thyroid. Feels like something is stuck in her throat and has become hard to swallow. Thinks intubation for her previous surgeries some months ago has caused her throat symptoms. Also has acid reflux that has not been controlled. Has not taken PPI for more than 10 years ago because she was advised to not continue by her PCP. Has applied ice and heat pads to the "knot" with decrease in size of the knot. Does not endorse epigastric pain, melena, BRBPR, f/c/n/v, CP, SOB, or cough.           Outpatient Medications Prior to Visit  Medication Sig Dispense Refill  . ferrous gluconate (FERGON) 324 MG tablet Take 1 tablet (324 mg total) by mouth 3 (three) times daily with meals. 100 tablet 3  . ibuprofen (ADVIL,MOTRIN) 600 MG tablet Take 1 tablet (600 mg total) by mouth every 6 (six) hours as needed (mild pain). 30 tablet 0  . senna (SENOKOT) 8.6 MG TABS tablet Take 1 tablet (8.6 mg total) by mouth at bedtime as needed for mild constipation. 120 each 0  . amLODipine (NORVASC) 5 MG tablet Take 1 tablet (5 mg total) by mouth daily. (Patient not taking: Reported on 12/14/2018) 90 tablet 1   No facility-administered medications prior to visit.      ROS Review of Systems  Constitutional: Positive for diaphoresis. Negative for chills, fever and malaise/fatigue.  Eyes: Negative for blurred vision.  Respiratory: Negative for shortness of  breath.   Cardiovascular: Negative for chest pain and palpitations.  Gastrointestinal: Negative for abdominal pain and nausea.  Genitourinary: Negative for dysuria and hematuria.  Musculoskeletal: Negative for joint pain and myalgias.  Skin: Negative for rash.  Neurological: Negative for tingling and headaches.  Endo/Heme/Allergies:       Hot flashes  Psychiatric/Behavioral: Negative for depression. The patient is nervous/anxious.     Objective:  BP (!) 167/111 (BP Location: Left Arm, Patient Position: Sitting, Cuff Size: Large)   Pulse 63   Temp 97.6 F (36.4 C) (Oral)   Ht 5\' 6"  (1.676 m)   Wt 243 lb 9.6 oz (110.5 kg)   LMP  (LMP Unknown)   SpO2 100%   BMI 39.32 kg/m   BP/Weight 12/14/2018 09/20/2018 04/04/8118  Systolic BP 147 829 562  Diastolic BP 130 88 84  Wt. (Lbs) 243.6 235.8 248.4  BMI 39.32 38.06 40.09      Physical Exam Vitals signs reviewed.  Constitutional:      Comments: Well developed, well nourished, NAD, polite  HENT:     Head: Normocephalic and atraumatic.     Comments: Oropharynx mildly erythematous Eyes:     General: No scleral icterus. Neck:     Musculoskeletal: Normal range of motion and neck supple. No muscular tenderness.     Thyroid: No thyromegaly.  Cardiovascular:     Rate and  Rhythm: Normal rate and regular rhythm.     Heart sounds: Normal heart sounds.  Pulmonary:     Effort: Pulmonary effort is normal.     Breath sounds: Normal breath sounds.  Lymphadenopathy:     Cervical: No cervical adenopathy.  Skin:    General: Skin is warm and dry.     Coloration: Skin is not pale.     Findings: No erythema or rash.  Neurological:     Mental Status: She is alert and oriented to person, place, and time.  Psychiatric:        Mood and Affect: Mood normal.        Behavior: Behavior normal.        Thought Content: Thought content normal.      Assessment & Plan:    1. Hypertension, unspecified type - Begin hydrochlorothiazide  (HYDRODIURIL) 12.5 MG tablet; Take 1 tablet (12.5 mg total) by mouth daily.  Dispense: 30 tablet; Refill: 2 - Stop Amlodipine 5 mg.  - Lipid panel  2. Hot flashes - FSH/LH  3. Night sweats - FSH/LH  4. History of hysterectomy - FSH/LH  5. Anemia, unspecified type - CBC with Differential  6. Screening for diabetes mellitus - HgB A1c 5.2%  7. Gastroesophageal reflux disease, esophagitis presence not specified - Begin omeprazole (PRILOSEC) 40 MG capsule; Take 1 capsule (40 mg total) by mouth daily.  Dispense: 30 capsule; Refill: 3 - Ambulatory referral to Gastroenterology  8. GAD (generalized anxiety disorder) - patient asked for Zoloft prescription for anxiety at discharge. Will need to properly assess mood disorder at future visit. This is also likely the reason we are seeing such large variations in her BP, however, can not be certain, as there is family history of HTN.  - Begin Sertraline 25 mg.    Meds ordered this encounter  Medications  . hydrochlorothiazide (HYDRODIURIL) 12.5 MG tablet    Sig: Take 1 tablet (12.5 mg total) by mouth daily.    Dispense:  30 tablet    Refill:  2    Order Specific Question:   Supervising Provider    Answer:   Charlott Rakes [[id number]]  . omeprazole (PRILOSEC) 40 MG capsule    Sig: Take 1 capsule (40 mg total) by mouth daily.    Dispense:  30 capsule    Refill:  3    Order Specific Question:   Supervising Provider    Answer:   Charlott Rakes [[id number]]    Follow-up: Return in about 6 weeks (around 01/25/2019) for HTN and GERD/throat discomfort.   Clent Demark PA

## 2018-12-14 NOTE — Patient Instructions (Signed)

## 2018-12-15 ENCOUNTER — Other Ambulatory Visit (INDEPENDENT_AMBULATORY_CARE_PROVIDER_SITE_OTHER): Payer: Self-pay | Admitting: Physician Assistant

## 2018-12-15 DIAGNOSIS — I10 Essential (primary) hypertension: Secondary | ICD-10-CM

## 2018-12-15 LAB — CBC WITH DIFFERENTIAL/PLATELET
BASOS: 1 %
Basophils Absolute: 0.1 10*3/uL (ref 0.0–0.2)
EOS (ABSOLUTE): 0.5 10*3/uL — ABNORMAL HIGH (ref 0.0–0.4)
EOS: 7 %
Hematocrit: 42.6 % (ref 34.0–46.6)
Hemoglobin: 13.1 g/dL (ref 11.1–15.9)
Immature Grans (Abs): 0 10*3/uL (ref 0.0–0.1)
Immature Granulocytes: 0 %
Lymphocytes Absolute: 2.1 10*3/uL (ref 0.7–3.1)
Lymphs: 31 %
MCH: 23.1 pg — ABNORMAL LOW (ref 26.6–33.0)
MCHC: 30.8 g/dL — ABNORMAL LOW (ref 31.5–35.7)
MCV: 75 fL — ABNORMAL LOW (ref 79–97)
Monocytes Absolute: 0.9 10*3/uL (ref 0.1–0.9)
Monocytes: 13 %
Neutrophils Absolute: 3.4 10*3/uL (ref 1.4–7.0)
Neutrophils: 48 %
Platelets: 300 10*3/uL (ref 150–450)
RBC: 5.67 x10E6/uL — ABNORMAL HIGH (ref 3.77–5.28)
RDW: 14.3 % (ref 12.3–15.4)
WBC: 6.9 10*3/uL (ref 3.4–10.8)

## 2018-12-15 LAB — LIPID PANEL
Chol/HDL Ratio: 2.1 ratio (ref 0.0–4.4)
Cholesterol, Total: 189 mg/dL (ref 100–199)
HDL: 92 mg/dL (ref >39)
LDL Calculated: 87 mg/dL (ref 0–99)
Triglycerides: 50 mg/dL (ref 0–149)
VLDL Cholesterol Cal: 10 mg/dL (ref 5–40)

## 2018-12-15 LAB — FSH/LH
FSH: 8.4 m[IU]/mL
LH: 1.9 m[IU]/mL

## 2018-12-15 MED ORDER — PRAVASTATIN SODIUM 20 MG PO TABS: 20.0000 mg | ORAL_TABLET | Freq: Every day | ORAL | 1 refills | Status: DC

## 2018-12-18 ENCOUNTER — Telehealth (INDEPENDENT_AMBULATORY_CARE_PROVIDER_SITE_OTHER): Payer: Self-pay

## 2018-12-18 NOTE — Telephone Encounter (Signed)
Patient is aware that she is no longer anemic. Not postmenopausal according to hormones. Cholesterol not optimal for hypertensive patient. Please pick up and begin pravastatin. Nat Christen, CMA

## 2018-12-18 NOTE — Telephone Encounter (Signed)
-----   Message from Clent Demark, PA-C sent at 12/15/2018  1:52 PM EST ----- No longer anemic. She is not postmenopausal according to her hormones. Cholesterol not optimal for hypertensive pt. I have [facility].

## 2018-12-28 ENCOUNTER — Other Ambulatory Visit (INDEPENDENT_AMBULATORY_CARE_PROVIDER_SITE_OTHER): Payer: Self-pay | Admitting: Physician Assistant

## 2018-12-28 DIAGNOSIS — F411 Generalized anxiety disorder: Secondary | ICD-10-CM

## 2018-12-28 NOTE — Telephone Encounter (Signed)
FWD to PCP. Tempestt S Roberts, CMA  

## 2018-12-29 ENCOUNTER — Other Ambulatory Visit (INDEPENDENT_AMBULATORY_CARE_PROVIDER_SITE_OTHER): Payer: Self-pay | Admitting: Physician Assistant

## 2018-12-29 DIAGNOSIS — F411 Generalized anxiety disorder: Secondary | ICD-10-CM

## 2019-01-01 ENCOUNTER — Other Ambulatory Visit (INDEPENDENT_AMBULATORY_CARE_PROVIDER_SITE_OTHER): Payer: Self-pay | Admitting: Physician Assistant

## 2019-01-01 DIAGNOSIS — F411 Generalized anxiety disorder: Secondary | ICD-10-CM

## 2019-01-01 MED ORDER — SERTRALINE HCL 25 MG PO TABS: 25.0000 mg | ORAL_TABLET | Freq: Every day | ORAL | 0 refills | Status: DC

## 2019-01-01 NOTE — Telephone Encounter (Signed)
[facility]ent to [facility]. Hopefully transmission will not fail this time.

## 2019-01-04 ENCOUNTER — Encounter (HOSPITAL_COMMUNITY): Payer: Self-pay

## 2019-01-04 ENCOUNTER — Ambulatory Visit
Admission: RE | Admit: 2019-01-04 | Discharge: 2019-01-04 | Disposition: A | Discharge status: Home / Self Care | Payer: Self-pay | Source: Ambulatory Visit | Attending: Obstetrics and Gynecology | Admitting: Obstetrics and Gynecology

## 2019-01-04 ENCOUNTER — Ambulatory Visit (HOSPITAL_COMMUNITY)
Admission: RE | Admit: 2019-01-04 | Discharge: 2019-01-04 | Disposition: A | Discharge status: Home / Self Care | Payer: Self-pay | Source: Ambulatory Visit | Attending: Obstetrics and Gynecology | Admitting: Obstetrics and Gynecology

## 2019-01-04 VITALS — BP 118/80 | Wt 243.0 lb

## 2019-01-04 DIAGNOSIS — Z1239 Encounter for other screening for malignant neoplasm of breast: Secondary | ICD-10-CM

## 2019-01-04 DIAGNOSIS — Z1231 Encounter for screening mammogram for malignant neoplasm of breast: Secondary | ICD-10-CM

## 2019-01-04 HISTORY — DX: Essential (primary) hypertension: I10

## 2019-01-04 NOTE — Progress Notes (Signed)
No complaints today.   Pap Smear: Pap smear not completed today. Last Pap smear was 09/14/2012 at the [facility] and normal. Per patient has no history of an abnormal Pap smear. Patient has a history of a hysterectomy 09/20/2018 due to AUB and fibroids. Patient no longer needs Pap smears due to her history of a hysterectomy for benign reasons per [facility] and ACOG guidelines. Last Pap smear result is in Epic.  Physical exam: Breasts Breasts symmetrical. No skin abnormalities bilateral breasts. No nipple retraction bilateral breasts. No nipple discharge bilateral breasts. No lymphadenopathy. No lumps palpated bilateral breasts. No complaints of pain or tenderness on exam. Referred patient to the [facility] for a screening mammogram. Appointment scheduled for Thursday, January 04, 2019 at 1440.        Pelvic/Bimanual No Pap smear completed today since patient has a history of a hysterectomy for benign reasons. Pap smear not indicated per [facility] guidelines.   Smoking History: Patient has never smoked.  Patient Navigation: Patient education provided. Access to services provided for patient through [facility] program. Spanish interpreter provided.   Colorectal Cancer Screening: Per patient had a colonoscopy completed 10 years ago. No complaints today. FIT Test given to patient to complete and return to [facility].  Breast and Cervical Cancer Risk Assessment: Patient has no family history of breast cancer, known genetic mutations, or radiation treatment to the chest before age 64. Patient has no history of cervical dysplasia, immunocompromised, or DES exposure in-utero.  Risk Assessment    Risk Scores      01/04/2019   Last edited by: Armond Hang, LPN   5-year risk: 1.2 %   Lifetime risk: 8.3 %

## 2019-01-04 NOTE — Patient Instructions (Signed)
Explained breast self awareness with [name]. Patient did not need a Pap smear today due to patient has a history of a hysterectomy for benign reasons. Let patient know that she doesn't need any further Pap smears due to her history of a hysterectomy for benign reasons.  Referred patient to the [facility] for a screening mammogram. Appointment scheduled for Thursday, January 04, 2019 at 1440. Patient aware of appointment and will be there. Let patient know the [facility] will follow up with her within the next couple weeks with results of mammogram by letter or phone. Christina Stanley verbalized understanding.  Bryttney Netzer, Arvil Chaco, RN 1:10 PM

## 2019-01-08 ENCOUNTER — Other Ambulatory Visit: Payer: Self-pay | Admitting: Obstetrics and Gynecology

## 2019-01-08 ENCOUNTER — Encounter (HOSPITAL_COMMUNITY): Payer: Self-pay | Admitting: *Deleted

## 2019-01-08 DIAGNOSIS — R928 Other abnormal and inconclusive findings on diagnostic imaging of breast: Secondary | ICD-10-CM

## 2019-01-16 ENCOUNTER — Other Ambulatory Visit: Payer: Self-pay

## 2019-01-19 ENCOUNTER — Inpatient Hospital Stay: Admission: RE | Admit: 2019-01-19 | Payer: PRIVATE HEALTH INSURANCE | Source: Ambulatory Visit

## 2019-01-25 ENCOUNTER — Ambulatory Visit (INDEPENDENT_AMBULATORY_CARE_PROVIDER_SITE_OTHER): Payer: Self-pay | Admitting: Family Medicine

## 2019-01-30 ENCOUNTER — Ambulatory Visit
Admission: RE | Admit: 2019-01-30 | Discharge: 2019-01-30 | Disposition: A | Discharge status: Home / Self Care | Payer: No Typology Code available for payment source | Source: Ambulatory Visit | Attending: Obstetrics and Gynecology | Admitting: Obstetrics and Gynecology

## 2019-01-30 DIAGNOSIS — R928 Other abnormal and inconclusive findings on diagnostic imaging of breast: Secondary | ICD-10-CM

## 2019-02-04 LAB — FECAL OCCULT BLOOD, IMMUNOCHEMICAL: FECAL OCCULT BLD: NEGATIVE

## 2019-02-05 ENCOUNTER — Telehealth (HOSPITAL_COMMUNITY): Payer: Self-pay | Admitting: *Deleted

## 2019-02-05 ENCOUNTER — Encounter (HOSPITAL_COMMUNITY): Payer: Self-pay

## 2019-02-05 NOTE — Telephone Encounter (Signed)
Telephoned patient at home number and advised patient of negative Fit Test results. Patient voiced understanding.

## 2019-03-06 ENCOUNTER — Encounter: Payer: Self-pay | Admitting: Physician Assistant

## 2019-08-24 ENCOUNTER — Telehealth (INDEPENDENT_AMBULATORY_CARE_PROVIDER_SITE_OTHER): Payer: No Typology Code available for payment source | Admitting: Primary Care

## 2019-08-29 ENCOUNTER — Encounter (HOSPITAL_COMMUNITY): Payer: Self-pay

## 2019-08-29 ENCOUNTER — Other Ambulatory Visit: Payer: Self-pay

## 2019-08-29 ENCOUNTER — Ambulatory Visit (HOSPITAL_COMMUNITY)
Admission: EM | Admit: 2019-08-29 | Discharge: 2019-08-29 | Disposition: A | Discharge status: Home / Self Care | Payer: Self-pay

## 2019-08-29 DIAGNOSIS — R109 Unspecified abdominal pain: Secondary | ICD-10-CM

## 2019-08-29 NOTE — ED Notes (Signed)
Patient appears to have eloped, patient labels were found tossed in the trash can and the room is empty. NP notified.

## 2019-08-29 NOTE — ED Triage Notes (Addendum)
Pt states she has left side pain . Pt states she thinks it's swollen. Pt says the pain is radiating into her back. Pt states her neck is swollen . Pt states she called the EMS today for herself because the pain was unbearable. She called them a hour ago.

## 2019-08-29 NOTE — ED Provider Notes (Signed)
[facility]    CSN: [id number] Arrival date & time: 08/29/19  1409      History   Chief Complaint Chief Complaint  Patient presents with  . Flank Pain    HPI Christina Stanley is a 53 y.o. female.   Patient presents with left side back pain and swelling x several day but worse today.  She also reports her neck was swollen but that has resolved.  She also reports her ears were hurting but are not now.  She states she called EMS earlier today for her pain.  She denies falls or injury.  She denies other symptoms such as fever, chills, sore throat, cough, chest pain, shortness of breath, abdominal pain, vomiting, diarrhea, dysuria, dizziness, weakness, headaches.  The history is provided by the patient.    Past Medical History:  Diagnosis Date  . Anemia   . Fibroids   . Hypertension     Patient Active Problem List   Diagnosis Date Noted  . Endometrial thickening on ultrasound 07/06/2018  . Anemia acute on chronic 07/05/2018  . Obesity 09/14/2012  . Leiomyoma of uterus, unspecified 08/10/2011  . Excessive or frequent menstruation 08/10/2011  . ACUTE BRONCHITIS 04/14/2009    Past Surgical History:  Procedure Laterality Date  . ABDOMINAL HYSTERECTOMY    . DILATION AND CURETTAGE OF UTERUS N/A 07/06/2018   Procedure: VAGINAL MUCOSA LACERATION REPAIR;  Surgeon: Chancy Milroy, MD;  Location: [facility];  Service: Gynecology;  Laterality: N/A;  . ECTOPIC PREGNANCY SURGERY     two pregnancy  . SALPINGECTOMY    . UTERINE FIBROID SURGERY  08/12/2010   diagnositic hysterscopy with D&C and removal of central fibroid.     OB History    Gravida  3   Para  1   Term  1   Preterm  0   AB  2   Living  1     SAB  0   TAB  0   Ectopic  2   Multiple      Live Births               Home Medications    Prior to Admission medications   Medication Sig Start Date End Date Taking? Authorizing Provider  ferrous gluconate (FERGON) 324 MG tablet Take 1 tablet (324  mg total) by mouth 3 (three) times daily with meals. Patient not taking: Reported on 01/04/2019 07/17/18   Everitt Amber, MD  hydrochlorothiazide (HYDRODIURIL) 12.5 MG tablet Take 1 tablet (12.5 mg total) by mouth daily. Patient not taking: Reported on 01/04/2019 12/14/18   Clent Demark, PA-C  ibuprofen (ADVIL,MOTRIN) 600 MG tablet Take 1 tablet (600 mg total) by mouth every 6 (six) hours as needed (mild pain). Patient not taking: Reported on 01/04/2019 07/06/18   Chancy Milroy, MD  omeprazole (PRILOSEC) 40 MG capsule Take 1 capsule (40 mg total) by mouth daily. Patient not taking: Reported on 01/04/2019 12/14/18   Clent Demark, PA-C  pravastatin (PRAVACHOL) 20 MG tablet Take 1 tablet (20 mg total) by mouth daily. Patient not taking: Reported on 01/04/2019 12/15/18   Clent Demark, PA-C  senna (SENOKOT) 8.6 MG TABS tablet Take 1 tablet (8.6 mg total) by mouth at bedtime as needed for mild constipation. Patient not taking: Reported on 01/04/2019 07/17/18   Everitt Amber, MD  sertraline (ZOLOFT) 25 MG tablet Take 1 tablet (25 mg total) by mouth daily. Patient not taking: Reported on 01/04/2019 01/01/19   Altamease Oiler,  Lesli Albee, PA-C    Family History Family History  Problem Relation Age of Onset  . Diabetes Mother   . Hypertension Mother   . Cancer Father        colon  . Diabetes Father   . Hypertension Father   . Diabetes Sister   . Hypertension Sister   . Cancer Maternal Grandmother     Social History Social History   Tobacco Use  . Smoking status: Never Smoker  . Smokeless tobacco: Never Used  Substance Use Topics  . Alcohol use: Yes    Comment: ocassional  . Drug use: No     Allergies   Amoxicillin   Review of Systems Review of Systems  Constitutional: Negative for chills and fever.  HENT: Positive for ear pain. Negative for congestion, rhinorrhea and sore throat.   Eyes: Negative for pain and visual disturbance.  Respiratory: Negative for cough and shortness of breath.    Cardiovascular: Negative for chest pain and palpitations.  Gastrointestinal: Negative for abdominal pain and vomiting.  Genitourinary: Negative for difficulty urinating, dysuria, flank pain, frequency, hematuria and urgency.  Musculoskeletal: Positive for back pain. Negative for arthralgias and neck pain.  Skin: Negative for color change and rash.  Neurological: Negative for dizziness, seizures, syncope, weakness, light-headedness and headaches.  All other systems reviewed and are negative.    Physical Exam Triage Vital Signs ED Triage Vitals  Enc Vitals Group     BP 08/29/19 1440 (!) 165/105     Pulse Rate 08/29/19 1440 76     Resp 08/29/19 1440 18     Temp 08/29/19 1440 97.6 F (36.4 C)     Temp Source 08/29/19 1440 Oral     SpO2 08/29/19 1440 99 %     Weight 08/29/19 1441 230 lb (104.3 kg)     Height --      Head Circumference --      Peak Flow --      Pain Score 08/29/19 1441 8     Pain Loc --      Pain Edu? --      Excl. in [place]? --    No data found.  Updated Vital Signs BP (!) 165/105 (BP Location: Right Arm)   Pulse 76   Temp 97.6 F (36.4 C) (Oral)   Resp 18   Wt 230 lb (104.3 kg)   LMP  (LMP Unknown)   SpO2 99%   BMI 37.12 kg/m   Visual Acuity Right Eye Distance:   Left Eye Distance:   Bilateral Distance:    Right Eye Near:   Left Eye Near:    Bilateral Near:     Physical Exam Vitals signs and nursing note reviewed.  Constitutional:      General: She is not in acute distress.    Appearance: She is well-developed.  HENT:     Head: Normocephalic and atraumatic.     Right Ear: Tympanic membrane normal.     Left Ear: Tympanic membrane normal.     Nose: Nose normal.     Mouth/Throat:     Mouth: Mucous membranes are moist.     Pharynx: Oropharynx is clear.  Eyes:     Extraocular Movements: Extraocular movements intact.     Conjunctiva/sclera: Conjunctivae normal.     Pupils: Pupils are equal, round, and reactive to light.  Neck:      Musculoskeletal: Neck supple.  Cardiovascular:     Rate and Rhythm: Normal rate and regular rhythm.  Heart sounds: Normal heart sounds. No murmur.  Pulmonary:     Effort: Pulmonary effort is normal. No respiratory distress.     Breath sounds: Normal breath sounds. No wheezing or rhonchi.  Abdominal:     General: Bowel sounds are normal.     Palpations: Abdomen is soft.     Tenderness: There is no abdominal tenderness. There is no right CVA tenderness, left CVA tenderness, guarding or rebound.  Musculoskeletal:        General: No swelling, tenderness or deformity.  Skin:    General: Skin is warm and dry.     Capillary Refill: Capillary refill takes less than 2 seconds.     Findings: No bruising, erythema, lesion or rash.  Neurological:     General: No focal deficit present.     Mental Status: She is alert and oriented to person, place, and time.     Sensory: No sensory deficit.     Motor: No weakness.     Gait: Gait normal.     Deep Tendon Reflexes: Reflexes normal.      UC Treatments / Results  Labs (all labs ordered are listed, but only abnormal results are displayed) Labs Reviewed - No data to display  EKG   Radiology No results found.  Procedures Procedures (including critical care time)  Medications Ordered in UC Medications - No data to display  Initial Impression / Assessment and Plan / UC Course  I have reviewed the triage vital signs and the nursing notes.  Pertinent labs & imaging results that were available during my care of the patient were reviewed by me and considered in my medical decision making (see chart for details).   Left flank pain.   Nurse went into room to obtain urine specimen and recheck vital signs; patient not in room or in facility.     Final Clinical Impressions(s) / UC Diagnoses   Final diagnoses:  Left flank pain   Discharge Instructions   None    ED Prescriptions    None     Controlled Substance Prescriptions [place]  Controlled Substance Registry consulted? Not Applicable   Sharion Balloon, NP 08/29/19 [phone]

## 2019-09-07 ENCOUNTER — Telehealth (INDEPENDENT_AMBULATORY_CARE_PROVIDER_SITE_OTHER): Payer: No Typology Code available for payment source | Admitting: Primary Care

## 2019-09-17 ENCOUNTER — Ambulatory Visit (INDEPENDENT_AMBULATORY_CARE_PROVIDER_SITE_OTHER): Payer: No Typology Code available for payment source | Admitting: Primary Care

## 2019-12-11 ENCOUNTER — Ambulatory Visit (INDEPENDENT_AMBULATORY_CARE_PROVIDER_SITE_OTHER): Payer: Self-pay | Admitting: Primary Care

## 2019-12-11 ENCOUNTER — Other Ambulatory Visit: Payer: Self-pay

## 2019-12-11 ENCOUNTER — Encounter (INDEPENDENT_AMBULATORY_CARE_PROVIDER_SITE_OTHER): Payer: Self-pay | Admitting: Primary Care

## 2019-12-11 DIAGNOSIS — Z760 Encounter for issue of repeat prescription: Secondary | ICD-10-CM

## 2019-12-11 DIAGNOSIS — I10 Essential (primary) hypertension: Secondary | ICD-10-CM

## 2019-12-11 DIAGNOSIS — E785 Hyperlipidemia, unspecified: Secondary | ICD-10-CM

## 2019-12-11 DIAGNOSIS — D649 Anemia, unspecified: Secondary | ICD-10-CM

## 2019-12-11 MED ORDER — HYDROCHLOROTHIAZIDE 12.5 MG PO TABS: 12.5000 mg | ORAL_TABLET | Freq: Every day | ORAL | 0 refills | Status: DC

## 2019-12-18 ENCOUNTER — Other Ambulatory Visit (INDEPENDENT_AMBULATORY_CARE_PROVIDER_SITE_OTHER): Payer: Self-pay

## 2019-12-18 ENCOUNTER — Other Ambulatory Visit: Payer: Self-pay

## 2019-12-18 DIAGNOSIS — D649 Anemia, unspecified: Secondary | ICD-10-CM

## 2019-12-18 DIAGNOSIS — I10 Essential (primary) hypertension: Secondary | ICD-10-CM

## 2019-12-18 DIAGNOSIS — E785 Hyperlipidemia, unspecified: Secondary | ICD-10-CM

## 2019-12-19 LAB — CBC WITH DIFFERENTIAL/PLATELET
Basophils Absolute: 0 10*3/uL (ref 0.0–0.2)
Basos: 1 %
EOS (ABSOLUTE): 0.5 10*3/uL — ABNORMAL HIGH (ref 0.0–0.4)
Eos: 7 %
Hematocrit: 38.9 % (ref 34.0–46.6)
Hemoglobin: 12.6 g/dL (ref 11.1–15.9)
Immature Grans (Abs): 0 10*3/uL (ref 0.0–0.1)
Immature Granulocytes: 0 %
Lymphocytes Absolute: 2.5 10*3/uL (ref 0.7–3.1)
Lymphs: 38 %
MCH: 23.9 pg — ABNORMAL LOW (ref 26.6–33.0)
MCHC: 32.4 g/dL (ref 31.5–35.7)
MCV: 74 fL — ABNORMAL LOW (ref 79–97)
Monocytes Absolute: 0.7 10*3/uL (ref 0.1–0.9)
Monocytes: 11 %
Neutrophils Absolute: 3 10*3/uL (ref 1.4–7.0)
Neutrophils: 43 %
Platelets: 265 10*3/uL (ref 150–450)
RBC: 5.27 x10E6/uL (ref 3.77–5.28)
RDW: 15.2 % (ref 11.7–15.4)
WBC: 6.8 10*3/uL (ref 3.4–10.8)

## 2019-12-19 LAB — CMP14+EGFR
ALT: 17 IU/L (ref 0–32)
AST: 15 IU/L (ref 0–40)
Albumin/Globulin Ratio: 1.4 (ref 1.2–2.2)
Albumin: 4.3 g/dL (ref 3.8–4.9)
Alkaline Phosphatase: 101 IU/L (ref 39–117)
BUN/Creatinine Ratio: 10 (ref 9–23)
BUN: 9 mg/dL (ref 6–24)
Bilirubin Total: 0.3 mg/dL (ref 0.0–1.2)
CO2: 24 mmol/L (ref 20–29)
Calcium: 9.5 mg/dL (ref 8.7–10.2)
Chloride: 101 mmol/L (ref 96–106)
Creatinine, Ser: 0.87 mg/dL (ref 0.57–1.00)
GFR calc Af Amer: 88 mL/min/{1.73_m2} (ref >59)
GFR calc non Af Amer: 76 mL/min/{1.73_m2} (ref >59)
Globulin, Total: 3.1 g/dL (ref 1.5–4.5)
Glucose: 105 mg/dL — ABNORMAL HIGH (ref 65–99)
Potassium: 4.5 mmol/L (ref 3.5–5.2)
Sodium: 138 mmol/L (ref 134–144)
Total Protein: 7.4 g/dL (ref 6.0–8.5)

## 2019-12-19 LAB — LIPID PANEL
Chol/HDL Ratio: 2.5 ratio (ref 0.0–4.4)
Cholesterol, Total: 171 mg/dL (ref 100–199)
HDL: 69 mg/dL (ref >39)
LDL Chol Calc (NIH): 91 mg/dL (ref 0–99)
Triglycerides: 53 mg/dL (ref 0–149)
VLDL Cholesterol Cal: 11 mg/dL (ref 5–40)

## 2019-12-29 NOTE — Progress Notes (Signed)
Virtual Visit via Telephone Note  I connected with Christina Stanley on 12/29/19 at  4:10 PM EST by telephone and verified that I am speaking with the correct person using two identifiers.   I discussed the limitations, risks, security and privacy concerns of performing an evaluation and management service by telephone and the availability of in person appointments. I also discussed with the patient that there may be a patient responsible charge related to this service. The patient expressed understanding and agreed to proceed.   History of Present Illness: Christina Stanley is having a tele visit for the management of hypertension, hyperlipidemia and anemia . Requesting medication refills. She denies shortness of breath, headaches, chest pain or lower extremity edema Past Medical History:  Diagnosis Date  . Anemia   . Fibroids   . Hypertension    Current Outpatient Medications on File Prior to Visit  Medication Sig Dispense Refill  . ferrous gluconate (FERGON) 324 MG tablet Take 1 tablet (324 mg total) by mouth 3 (three) times daily with meals. (Patient not taking: Reported on 01/04/2019) 100 tablet 3  . ibuprofen (ADVIL,MOTRIN) 600 MG tablet Take 1 tablet (600 mg total) by mouth every 6 (six) hours as needed (mild pain). (Patient not taking: Reported on 01/04/2019) 30 tablet 0  . omeprazole (PRILOSEC) 40 MG capsule Take 1 capsule (40 mg total) by mouth daily. (Patient not taking: Reported on 01/04/2019) 30 capsule 3  . pravastatin (PRAVACHOL) 20 MG tablet Take 1 tablet (20 mg total) by mouth daily. (Patient not taking: Reported on 01/04/2019) 90 tablet 1  . senna (SENOKOT) 8.6 MG TABS tablet Take 1 tablet (8.6 mg total) by mouth at bedtime as needed for mild constipation. (Patient not taking: Reported on 01/04/2019) 120 each 0  . sertraline (ZOLOFT) 25 MG tablet Take 1 tablet (25 mg total) by mouth daily. (Patient not taking: Reported on 01/04/2019) 90 tablet 0   No current facility-administered medications on  file prior to visit.   Observations/Objective: [name] was seen today for establish care and medication refill.  Diagnoses and all orders for this visit:  Anemia, unspecified type History of anemia last labs reviewed normal with low shape and size of RBC. To include Iron rich foods such as shellfish,liver, organ meats(liver, gizzard), and red meats can increase cholesterol and should be consumed in moderation.However; legumes(beans), spinach, pumpkin seeds, [place], broccoli, tofu, green leafy vegetables and dark chocolate can be consumed without concern to cholesterol.   -     CBC with Differential future; Future  Hypertension, unspecified type  Goal for Bp is 130/80, low-sodium, DASH diet,reccomened and  medication compliance, 150 minutes of moderate intensity exercise per week. Discussed medication compliance, adverse effects.Refill HCTZ -     hydrochlorothiazide (HYDRODIURIL) 12.5 MG tablet; Take 1 tablet (12.5 mg total) by mouth daily. -     CMP14+EGFR; Future  Hyperlipidemia, unspecified hyperlipidemia type Stop taking pravastatin will check lipid panel . Encourage decrease your fatty foods, red meat, cheese, milk and increase fiber like whole grains and veggies. You can also add a fiber supplement like Metamucil or Benefiber.       Lipid panel - future  Assessment and Plan:   Follow Up Instructions:    I discussed the assessment and treatment plan with the patient. The patient was provided an opportunity to ask questions and all were answered. The patient agreed with the plan and demonstrated an understanding of the instructions.   The patient was advised to call back or  seek an in-person evaluation if the symptoms worsen or if the condition fails to improve as anticipated.  I provided 5mnutes of non-face-to-face time during this encounter.    MKerin Perna NP

## 2020-01-07 ENCOUNTER — Other Ambulatory Visit (INDEPENDENT_AMBULATORY_CARE_PROVIDER_SITE_OTHER): Payer: Self-pay | Admitting: Primary Care

## 2020-01-07 DIAGNOSIS — I10 Essential (primary) hypertension: Secondary | ICD-10-CM

## 2020-01-07 NOTE — Telephone Encounter (Signed)
FWD to PCP

## 2020-01-10 ENCOUNTER — Other Ambulatory Visit: Payer: Self-pay | Admitting: *Deleted

## 2020-01-10 ENCOUNTER — Telehealth (INDEPENDENT_AMBULATORY_CARE_PROVIDER_SITE_OTHER): Payer: Self-pay

## 2020-01-10 DIAGNOSIS — I10 Essential (primary) hypertension: Secondary | ICD-10-CM

## 2020-01-10 MED ORDER — PRAVASTATIN SODIUM 20 MG PO TABS: 20.0000 mg | ORAL_TABLET | Freq: Every day | ORAL | 1 refills | Status: DC

## 2020-01-10 MED ORDER — HYDROCHLOROTHIAZIDE 12.5 MG PO TABS: 12.5000 mg | ORAL_TABLET | Freq: Every day | ORAL | 0 refills | Status: DC

## 2020-01-10 NOTE — Telephone Encounter (Signed)
Done

## 2020-01-10 NOTE — Telephone Encounter (Signed)
Patient called to make a medication refill for pravastatin (PRAVACHOL) 20 MG tablet    Patient uses [id number] - [place], [place]   Patient will also like to know if PCP could send a RX for hydrochlorothiazide (HYDRODIURIL) 12.5 MG tablet  For 25 mg instead of the 12.5. patient states that the pharmacy is charging her more for them to cut the medication and she would prefer to pay less even if she has to cut the medication herself.   Please advice [phone]

## 2020-02-04 ENCOUNTER — Other Ambulatory Visit (INDEPENDENT_AMBULATORY_CARE_PROVIDER_SITE_OTHER): Payer: Self-pay | Admitting: Primary Care

## 2020-02-04 DIAGNOSIS — I10 Essential (primary) hypertension: Secondary | ICD-10-CM

## 2020-03-08 ENCOUNTER — Other Ambulatory Visit: Payer: Self-pay | Admitting: Primary Care

## 2020-03-08 DIAGNOSIS — I10 Essential (primary) hypertension: Secondary | ICD-10-CM

## 2020-03-24 ENCOUNTER — Other Ambulatory Visit: Payer: Self-pay | Admitting: Primary Care

## 2020-03-24 ENCOUNTER — Other Ambulatory Visit (INDEPENDENT_AMBULATORY_CARE_PROVIDER_SITE_OTHER): Payer: Self-pay | Admitting: Primary Care

## 2020-03-24 DIAGNOSIS — I10 Essential (primary) hypertension: Secondary | ICD-10-CM

## 2020-03-24 NOTE — Telephone Encounter (Signed)
Request for 90 day  supply

## 2020-04-16 IMAGING — US US PELVIS COMPLETE
1 series · 15 of 25 positions shown · non-contrast
Comparison: Ultrasound 02/04/2010

CLINICAL DATA: Abnormal uterine bleeding for 6 months. Bleeding is
heavy, with clots. History of fibroid surgery, bilateral
salpingectomy secondary to ectopic pregnancy. History of
menorrhagia.

EXAM:
TRANSABDOMINAL ULTRASOUND OF PELVIS
TECHNIQUE: Transabdominal ultrasound examination of the pelvis was performed
including evaluation of the uterus, ovaries, adnexal regions, and
pelvic cul-de-sac.

[Series 1: us pelvis complete · 15 of 29 slices shown]
[im 1/29]
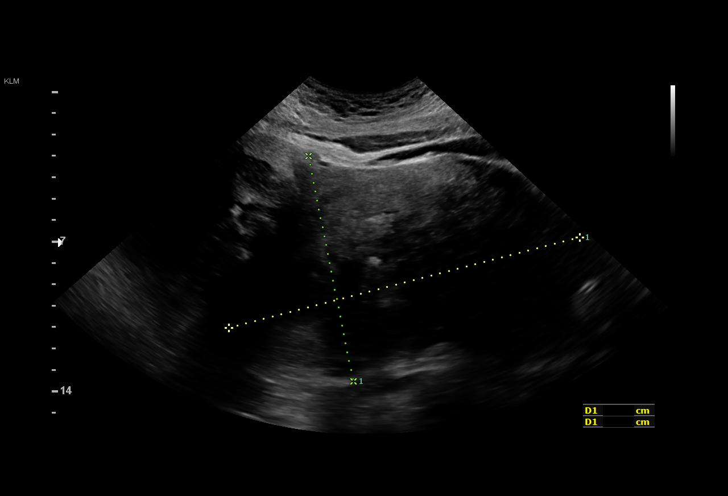
[im 3/29]
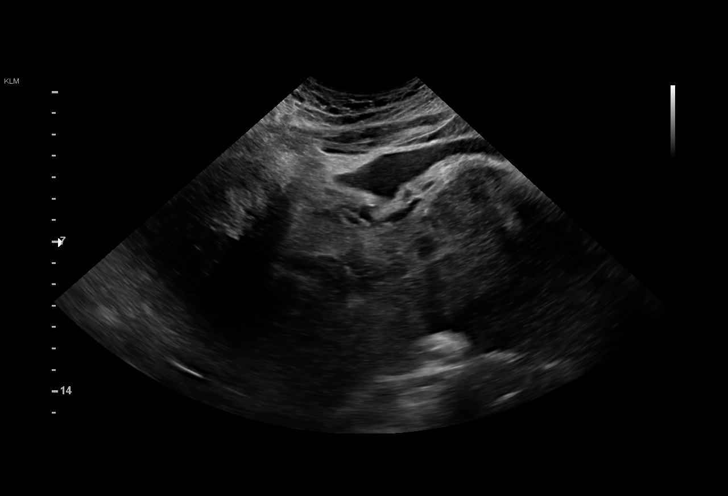
[im 5/29]
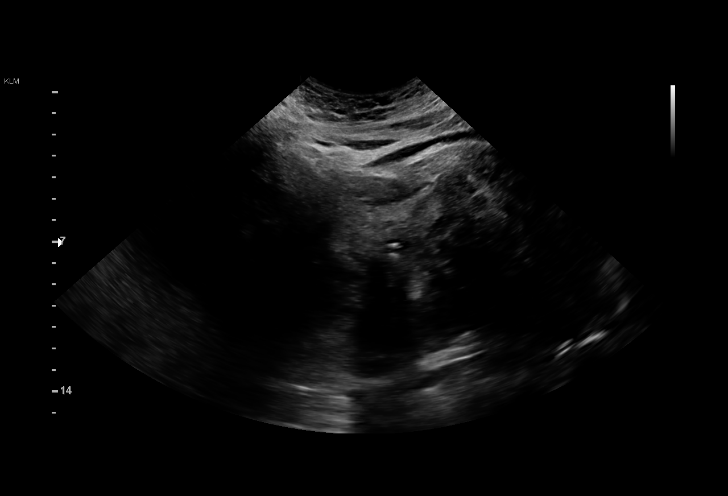
[im 6/29]
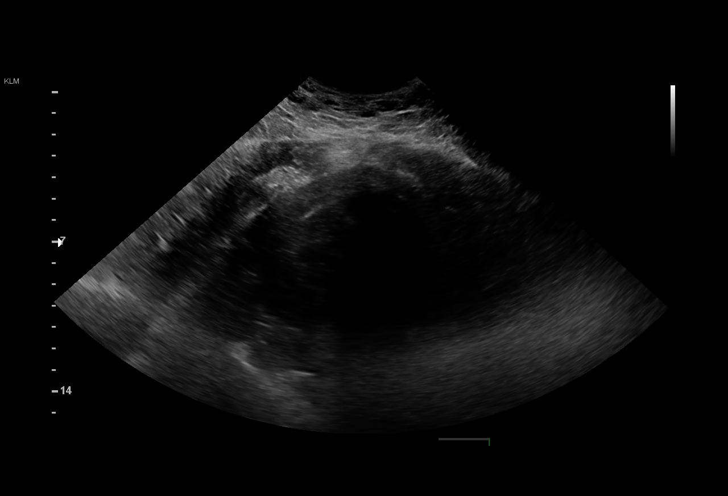
[im 9/29]
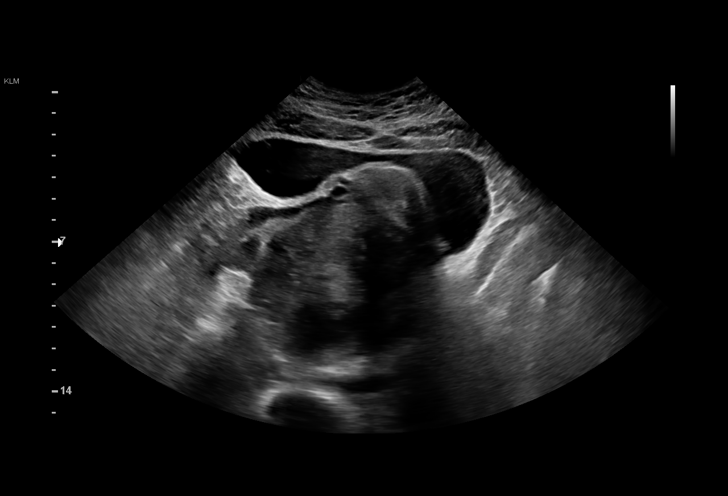
[im 11/29]
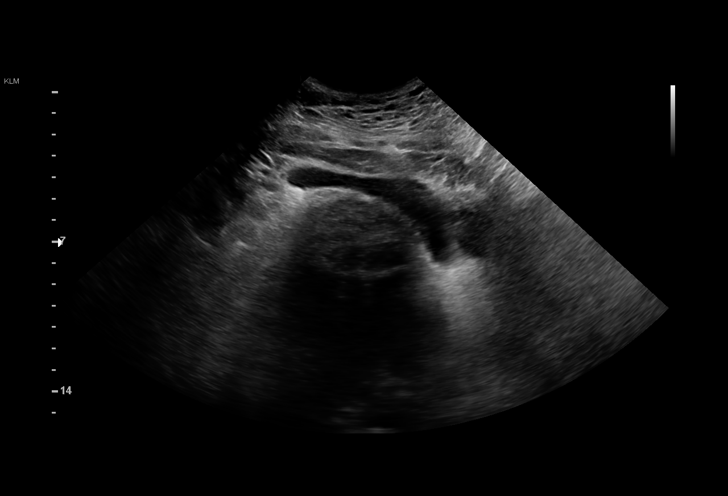
[im 12/29]
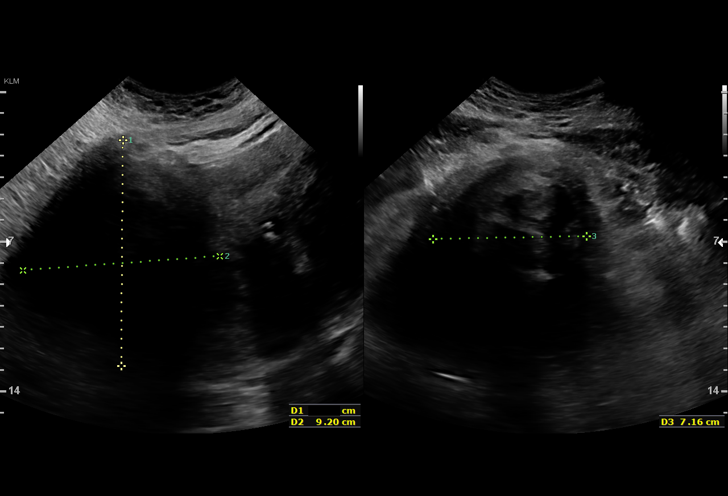
[im 15/29]
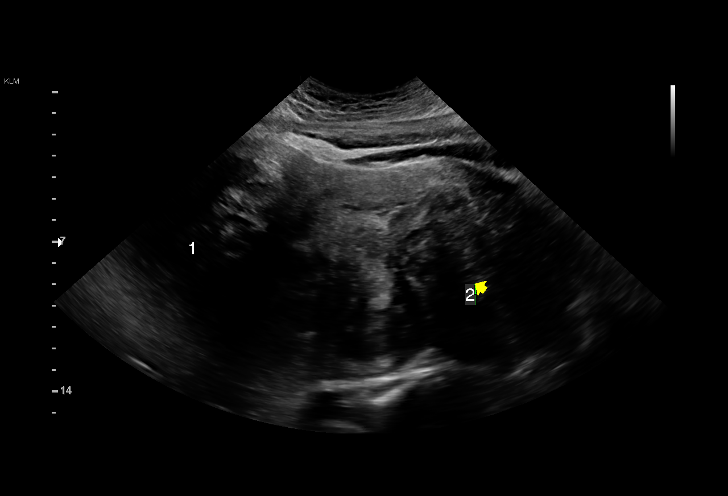
[im 17/29]
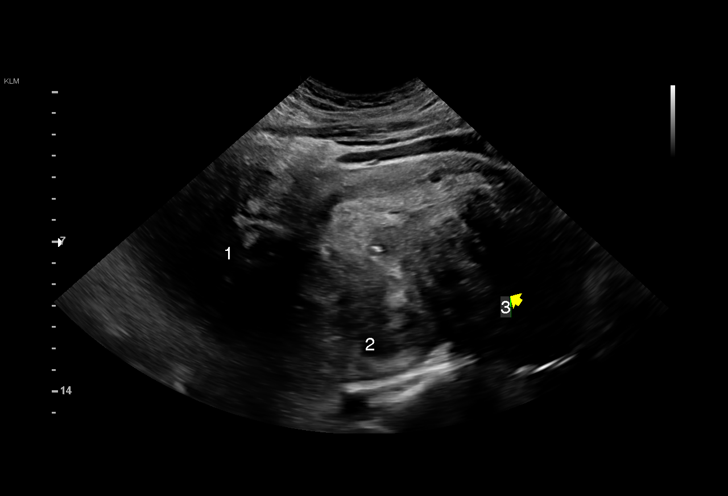
[im 18/29]
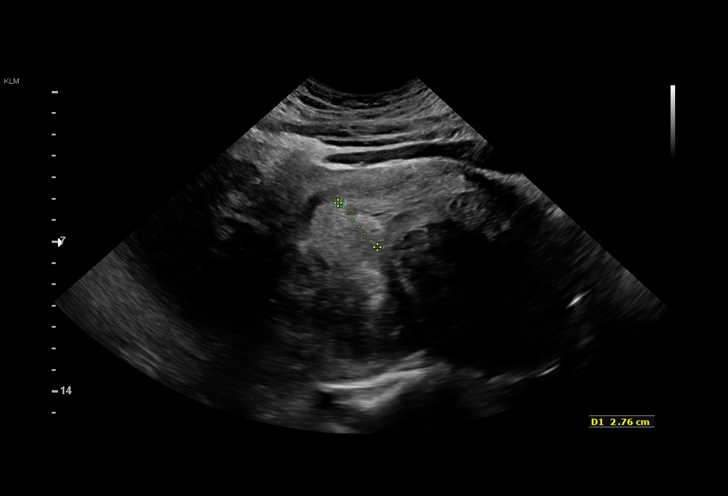
[im 20/29]
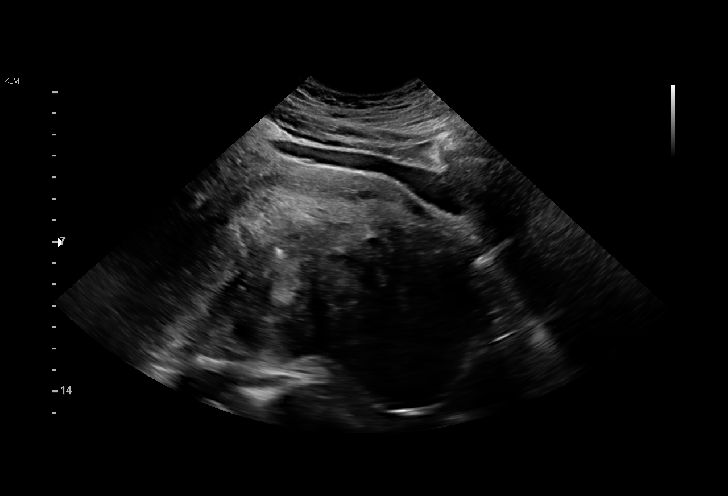
[im 23/29]
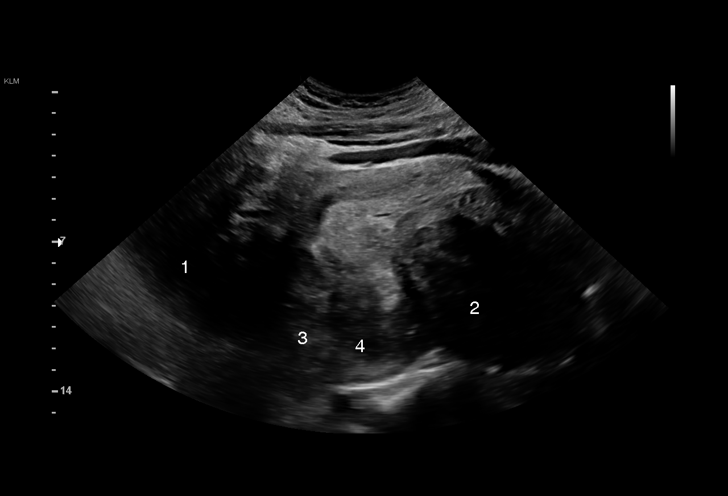
[im 24/29]
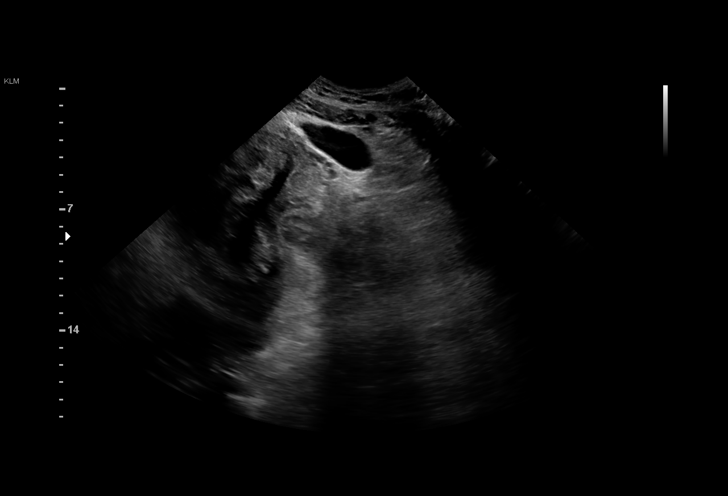
[im 26/29]
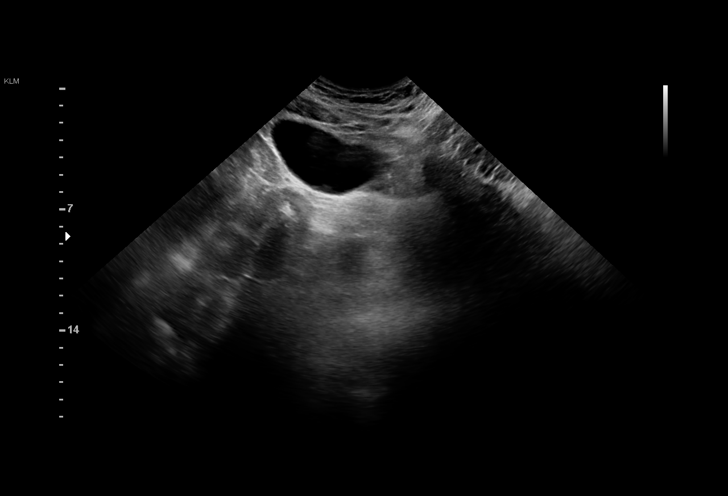
[im 29/29]
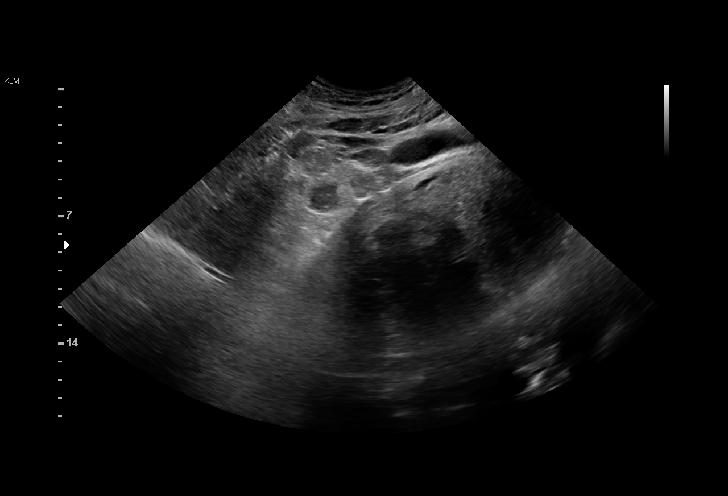

[15 of 25 positions shown; findings below may reference images not displayed]

FINDINGS: Uterus

Measurements: At least and probably greater than 60.9 x 10.7 x
centimeters. Numerous fibroids enlarged and distort the uterine
anatomy.

Large fundal fibroid is 10.5 x 9.1 x 12 centimeters.

Fibroid near the cervix is 7.2 x 9.2 x 6.4 centimeters.

Posterior fibroid is 3.4 x 2.5 x 3.4 centimeters.

Posterior fibroid is 4.9 x 1.6 x 3.7 centimeters.

Endometrium

Thickness: Difficult to visualized but estimated to measure 28
millimeters.. No focal abnormality visualized.

Right ovary

Measurements: The ovary is not visualized, either absent or
obscured. No adnexal mass.

Left ovary

Measurements: The ovary is not visualized, either absent or
obscured. No adnexal mass.

Other findings:  No free pelvic fluid.
IMPRESSION: 1. Enlarged uterus containing numerous fibroids.
2. Endometrial stripe is difficult to measure given the distortion
of the uterine anatomy but is estimated to be 28 millimeters.
3. If bleeding remains unresponsive to hormonal or medical therapy,
focal lesion work-up with sonohysterogram should be considered.
Endometrial biopsy should also be considered in pre-menopausal
patients at high risk for endometrial carcinoma. (Ref: Radiological
Reasoning: Algorithmic Workup of Abnormal Vaginal Bleeding with
Endovaginal Sonography and Sonohysterography. AJR [id number]; 191:S68-73)
4. Nonvisualized ovaries.

## 2020-09-17 ENCOUNTER — Ambulatory Visit: Payer: No Typology Code available for payment source

## 2020-09-24 ENCOUNTER — Other Ambulatory Visit: Payer: Self-pay

## 2020-09-24 DIAGNOSIS — N6320 Unspecified lump in the left breast, unspecified quadrant: Secondary | ICD-10-CM

## 2020-10-03 ENCOUNTER — Other Ambulatory Visit: Payer: Self-pay

## 2020-10-03 DIAGNOSIS — N6320 Unspecified lump in the left breast, unspecified quadrant: Secondary | ICD-10-CM

## 2020-10-07 ENCOUNTER — Ambulatory Visit
Admission: RE | Admit: 2020-10-07 | Discharge: 2020-10-07 | Disposition: A | Discharge status: Home / Self Care | Payer: No Typology Code available for payment source | Source: Ambulatory Visit | Attending: Obstetrics and Gynecology | Admitting: Obstetrics and Gynecology

## 2020-10-07 ENCOUNTER — Other Ambulatory Visit: Payer: Self-pay

## 2020-10-07 ENCOUNTER — Ambulatory Visit: Payer: Self-pay | Admitting: *Deleted

## 2020-10-07 ENCOUNTER — Ambulatory Visit: Payer: No Typology Code available for payment source

## 2020-10-07 VITALS — BP 126/84 | Temp 97.1°F | Wt 260.3 lb

## 2020-10-07 DIAGNOSIS — N644 Mastodynia: Secondary | ICD-10-CM

## 2020-10-07 DIAGNOSIS — Z1211 Encounter for screening for malignant neoplasm of colon: Secondary | ICD-10-CM

## 2020-10-07 DIAGNOSIS — Z1239 Encounter for other screening for malignant neoplasm of breast: Secondary | ICD-10-CM

## 2020-10-07 DIAGNOSIS — N6320 Unspecified lump in the left breast, unspecified quadrant: Secondary | ICD-10-CM

## 2020-10-07 NOTE — Progress Notes (Signed)
Christina Stanley is a 54 y.o. female who presents to [facility] clinic today with complaint of left upper breast lump for almost 2 years that comes and goes. Patient complained of left breast pain that comes and goes. Patient rates the pain at a 9 out of 10..    Pap Smear: Pap smear not completed today. Last Pap smear was 09/14/2012 at [facility] [place] [facility] and normal. Per patient has no history of an abnormal Pap smear. Patient has a history of a hysterectomy 09/20/2018 due to AUB and fibroids. Patient no longer needs Pap smears due to her history of a hysterectomy for benign reasons per BCCCP and ACOG guidelines. Last Pap smear result is in Epic.  Physical exam: Breasts Breasts symmetrical. No skin abnormalities bilateral breasts. No nipple retraction bilateral breasts. No nipple discharge bilateral breasts. No lymphadenopathy. No lumps palpated bilateral breasts. Unable to palpate a lump in patients area of concern within the left breast. Patient complained of left diffuse and right upper breast pain on exam.      Pelvic/Bimanual Pap is not indicated today per BCCCP guidelines.   Smoking History: Patient has never smoked.   Patient Navigation: Patient education provided. Access to services provided for patient through [place] program.   Colorectal Cancer Screening: Per patient had a colonoscopy completed 12 years ago. FIT Test completed 01/16/2019 and negative. No complaints today. FIT Test given to patientto complete.   Breast and Cervical Cancer Risk Assessment: Patient does not have family history of breast cancer, known genetic mutations, or radiation treatment to the chest before age 34. Patient does not have history of cervical dysplasia, immunocompromised, or DES exposure in-utero.  Risk Assessment    Risk Scores      10/07/2020 01/04/2019   Last edited by: Demetrius Revel, LPN Armond Hang, LPN   5-year risk: 1.3 % 1.2 %   Lifetime risk: 8.2 % 8.3 %           A: BCCCP exam without pap smear Complaint of left upper breast lump and pain.  P: Referred patient to the [facility] for a diagnostic mammogram. Appointment scheduled Tuesday, October 07, 2020 at 1240.   Loletta Parish, RN 10/07/2020 9:00 AM

## 2020-10-07 NOTE — Patient Instructions (Signed)
Explained breast self awareness with [name]. Patient did not need a Pap smear today due to her history of a hysterectomy for benign reasons. Let patient know that she doesn't need any further Pap smears due to her history of a hysterectomy for benign reasons. Referred patient to the [facility] for a diagnostic mammogram. Appointment scheduled Tuesday, October 07, 2020 at 1240. Patient aware of appointment and will be there. Christina Stanley verbalized understanding.  Ruqayyah Lute, Arvil Chaco, RN 9:00 AM

## 2020-11-25 ENCOUNTER — Other Ambulatory Visit (INDEPENDENT_AMBULATORY_CARE_PROVIDER_SITE_OTHER): Payer: Self-pay | Admitting: Primary Care

## 2020-11-25 DIAGNOSIS — I10 Essential (primary) hypertension: Secondary | ICD-10-CM

## 2020-11-25 MED ORDER — HYDROCHLOROTHIAZIDE 12.5 MG PO TABS: 12.5000 mg | ORAL_TABLET | Freq: Every day | ORAL | 0 refills | Status: DC

## 2020-11-25 MED ORDER — PRAVASTATIN SODIUM 20 MG PO TABS: 20.0000 mg | ORAL_TABLET | Freq: Every day | ORAL | 0 refills | Status: DC

## 2020-11-25 NOTE — Telephone Encounter (Signed)
Medication Refill - Medication: Hydrochlorothiazide 12.5 and the Pravastatin 20 mg  Has the patient contacted their pharmacy? No. (Agent: If no, request that the patient contact the pharmacy for the refill.) (Agent: If yes, when and what did the pharmacy advise?)  Preferred Pharmacy (with phone number or street name): [facility] on [address]: Please be advised that RX refills may take up to 3 business days. We ask that you follow-up with your pharmacy.2

## 2020-11-25 NOTE — Telephone Encounter (Signed)
Patient called and advised an appointment is due for refills. Physical scheduled for Thursday, 12/18/20 at 1010 with Juluis Mire, NP. Advised fasting for labs.

## 2020-12-16 ENCOUNTER — Other Ambulatory Visit (INDEPENDENT_AMBULATORY_CARE_PROVIDER_SITE_OTHER): Payer: Self-pay | Admitting: Primary Care

## 2020-12-16 DIAGNOSIS — I10 Essential (primary) hypertension: Secondary | ICD-10-CM

## 2020-12-16 NOTE — Telephone Encounter (Signed)
° °  Notes to clinic:  Patient has upcoming appt on 12/18/2020 Patient should have enough until appt 30 tabs given on 11/25/2020   Requested Prescriptions  Pending Prescriptions Disp Refills   hydrochlorothiazide (HYDRODIURIL) 12.5 MG tablet [Pharmacy Med Name: HYDROCHLOROTHIAZIDE 12.5 MG TB] 90 tablet 1    Sig: TAKE 1 TABLET BY MOUTH EVERY DAY      Cardiovascular: Diuretics - Thiazide Failed - 12/16/2020 12:08 AM      Failed - Ca in normal range and within 360 days    Calcium  Date Value Ref Range Status  12/18/2019 9.5 8.7 - 10.2 mg/dL Final          Failed - Cr in normal range and within 360 days    Creatinine, Ser  Date Value Ref Range Status  12/18/2019 0.87 0.57 - 1.00 mg/dL Final          Failed - K in normal range and within 360 days    Potassium  Date Value Ref Range Status  12/18/2019 4.5 3.5 - 5.2 mmol/L Final          Failed - Na in normal range and within 360 days    Sodium  Date Value Ref Range Status  12/18/2019 138 134 - 144 mmol/L Final          Failed - Valid encounter within last 6 months    Recent Outpatient Visits           1 year ago Anemia, unspecified type   [place], [place], NP   2 years ago Hypertension, unspecified type   [place], Roger David, PA-C   2 years ago Encounter to establish care   [place], Roger David, PA-C       Future Appointments             In 2 days Kerin Perna, NP [place] - Last BP in normal range    BP Readings from Last 1 Encounters:  10/07/20 126/84

## 2020-12-18 ENCOUNTER — Encounter (INDEPENDENT_AMBULATORY_CARE_PROVIDER_SITE_OTHER): Payer: Self-pay | Admitting: Primary Care

## 2020-12-18 ENCOUNTER — Other Ambulatory Visit: Payer: Self-pay

## 2020-12-18 ENCOUNTER — Ambulatory Visit (INDEPENDENT_AMBULATORY_CARE_PROVIDER_SITE_OTHER): Payer: Self-pay | Admitting: Primary Care

## 2020-12-18 VITALS — BP 138/90 | HR 63 | Temp 97.5°F | Ht 66.0 in | Wt 264.6 lb

## 2020-12-18 DIAGNOSIS — Z23 Encounter for immunization: Secondary | ICD-10-CM

## 2020-12-18 DIAGNOSIS — E785 Hyperlipidemia, unspecified: Secondary | ICD-10-CM

## 2020-12-18 DIAGNOSIS — D649 Anemia, unspecified: Secondary | ICD-10-CM

## 2020-12-18 DIAGNOSIS — I10 Essential (primary) hypertension: Secondary | ICD-10-CM

## 2020-12-18 DIAGNOSIS — Z1211 Encounter for screening for malignant neoplasm of colon: Secondary | ICD-10-CM

## 2020-12-18 MED ORDER — HYDROCHLOROTHIAZIDE 25 MG PO TABS: 25.0000 mg | ORAL_TABLET | Freq: Every day | ORAL | 3 refills | Status: DC

## 2020-12-18 MED ORDER — HYDROCHLOROTHIAZIDE 12.5 MG PO TABS: 12.5000 mg | ORAL_TABLET | Freq: Every day | ORAL | 1 refills | Status: DC

## 2020-12-18 NOTE — Patient Instructions (Signed)

## 2020-12-18 NOTE — Progress Notes (Signed)
Established Patient Office Visit  Subjective:  Patient ID: Christina Stanley, female    DOB: December 08, 1966  Age: 54 y.o. MRN: [id number]  CC:  Chief Complaint  Patient presents with  . Annual Exam  . Medication Refill    HPI [name] presents for annual exam and medication refills. She voices no problems or concerns   Past Medical History:  Diagnosis Date  . Anemia   . Fibroids   . Hyperlipidemia   . Hypertension     Past Surgical History:  Procedure Laterality Date  . ABDOMINAL HYSTERECTOMY    . DILATION AND CURETTAGE OF UTERUS N/A 07/06/2018   Procedure: VAGINAL MUCOSA LACERATION REPAIR;  Surgeon: Chancy Milroy, MD;  Location: [facility];  Service: Gynecology;  Laterality: N/A;  . ECTOPIC PREGNANCY SURGERY     two pregnancy  . SALPINGECTOMY    . UTERINE FIBROID SURGERY  08/12/2010   diagnositic hysterscopy with D&C and removal of central fibroid.     Family History  Problem Relation Age of Onset  . Diabetes Mother   . Hypertension Mother   . Cancer Father        colon  . Diabetes Father   . Hypertension Father   . Diabetes Sister   . Hypertension Sister   . Cancer Maternal Grandmother     Social History   Socioeconomic History  . Marital status: Single    Spouse name: Not on file  . Number of children: 1  . Years of education: Not on file  . Highest education level: High school graduate  Occupational History  . Not on file  Tobacco Use  . Smoking status: Never Smoker  . Smokeless tobacco: Never Used  Vaping Use  . Vaping Use: Never used  Substance and Sexual Activity  . Alcohol use: Yes    Comment: ocassional  . Drug use: No  . Sexual activity: Yes    Birth control/protection: Surgical  Other Topics Concern  . Not on file  Social History Narrative  . Not on file   Social Determinants of Health   Financial Resource Strain: Not on file  Food Insecurity: Not on file  Transportation Needs: No Transportation Needs  . Lack of Transportation  (Medical): No  . Lack of Transportation (Non-Medical): No  Physical Activity: Not on file  Stress: Not on file  Social Connections: Not on file  Intimate Partner Violence: Not on file    Outpatient Medications Prior to Visit  Medication Sig Dispense Refill  . pravastatin (PRAVACHOL) 20 MG tablet Take 1 tablet (20 mg total) by mouth daily. APPOINTMENT NEEDED FOR ADDITIONAL REFILLS 30 tablet 0  . hydrochlorothiazide (HYDRODIURIL) 12.5 MG tablet Take 1 tablet (12.5 mg total) by mouth daily. APPOINTMENT NEEDED FOR ADDITIONAL REFILLS 30 tablet 0  . ferrous gluconate (FERGON) 324 MG tablet Take 1 tablet (324 mg total) by mouth 3 (three) times daily with meals. (Patient not taking: Reported on 01/04/2019) 100 tablet 3  . ibuprofen (ADVIL,MOTRIN) 600 MG tablet Take 1 tablet (600 mg total) by mouth every 6 (six) hours as needed (mild pain). (Patient not taking: Reported on 01/04/2019) 30 tablet 0  . omeprazole (PRILOSEC) 40 MG capsule Take 1 capsule (40 mg total) by mouth daily. (Patient not taking: Reported on 01/04/2019) 30 capsule 3  . senna (SENOKOT) 8.6 MG TABS tablet Take 1 tablet (8.6 mg total) by mouth at bedtime as needed for mild constipation. (Patient not taking: Reported on 01/04/2019) 120 each 0  .  sertraline (ZOLOFT) 25 MG tablet Take 1 tablet (25 mg total) by mouth daily. (Patient not taking: Reported on 01/04/2019) 90 tablet 0   No facility-administered medications prior to visit.    Allergies  Allergen Reactions  . Amoxicillin Rash    Has patient had a PCN reaction causing immediate rash, facial/tongue/throat swelling, SOB or lightheadedness with hypotension: No Has patient had a PCN reaction causing severe rash involving mucus membranes or skin necrosis: No Has patient had a PCN reaction that required hospitalization: No Has patient had a PCN reaction occurring within the last 10 years: No If all of the above answers are "NO", then may proceed with Cephalosporin use.     ROS Review of  Systems  All other systems reviewed and are negative.     Objective:    Physical Exam Vitals reviewed.  Constitutional:      Appearance: Normal appearance. She is obese.  HENT:     Right Ear: Tympanic membrane normal.     Left Ear: Tympanic membrane normal.     Nose: Nose normal.  Eyes:     Extraocular Movements: Extraocular movements intact.     Pupils: Pupils are equal, round, and reactive to light.  Cardiovascular:     Rate and Rhythm: Normal rate and regular rhythm.  Pulmonary:     Effort: Pulmonary effort is normal.     Breath sounds: Normal breath sounds.  Abdominal:     General: Bowel sounds are normal. There is distension.     Palpations: Abdomen is soft.  Musculoskeletal:        General: Normal range of motion.     Cervical back: Normal range of motion and neck supple.  Skin:    General: Skin is warm and dry.  Neurological:     Mental Status: She is alert and oriented to person, place, and time.  Psychiatric:        Mood and Affect: Mood normal.        Behavior: Behavior normal.        Thought Content: Thought content normal.        Judgment: Judgment normal.     BP 138/90 (BP Location: Right Arm, Patient Position: Sitting, Cuff Size: Large)   Pulse 63   Temp (!) 97.5 F (36.4 C) (Temporal)   Ht 5\' 6"  (1.676 m)   Wt 264 lb 9.6 oz (120 kg)   LMP  (LMP Unknown)   SpO2 96%   BMI 42.71 kg/m  Wt Readings from Last 3 Encounters:  12/18/20 264 lb 9.6 oz (120 kg)  10/07/20 260 lb 4.8 oz (118.1 kg)  08/29/19 230 lb (104.3 kg)     Health Maintenance Due  Topic Date Due  . COVID-19 Vaccine (1) Never done  . TETANUS/TDAP  Never done  . PAP SMEAR-Modifier  09/15/2015  . Fecal DNA (Cologuard)  Never done    There are no preventive care reminders to display for this patient.  Lab Results  Component Value Date   TSH 2.715 07/05/2018   Lab Results  Component Value Date   WBC 6.8 12/18/2019   HGB 12.6 12/18/2019   HCT 38.9 12/18/2019   MCV 74 (L)  12/18/2019   PLT 265 12/18/2019   Lab Results  Component Value Date   NA 138 12/18/2019   K 4.5 12/18/2019   CO2 24 12/18/2019   GLUCOSE 105 (H) 12/18/2019   BUN 9 12/18/2019   CREATININE 0.87 12/18/2019   BILITOT 0.3 12/18/2019  ALKPHOS 101 12/18/2019   AST 15 12/18/2019   ALT 17 12/18/2019   PROT 7.4 12/18/2019   ALBUMIN 4.3 12/18/2019   CALCIUM 9.5 12/18/2019   ANIONGAP 8 07/05/2018   Lab Results  Component Value Date   CHOL 171 12/18/2019   Lab Results  Component Value Date   HDL 69 12/18/2019   Lab Results  Component Value Date   LDLCALC 91 12/18/2019   Lab Results  Component Value Date   TRIG 53 12/18/2019   Lab Results  Component Value Date   CHOLHDL 2.5 12/18/2019   Lab Results  Component Value Date   HGBA1C 5.6 12/14/2018      Assessment & Plan:  Shirleyann was seen today for annual exam and medication refill.  Diagnoses and all orders for this visit:  Need for Tdap vaccination -     Tdap vaccine greater than or equal to 7yo IM  Hypertension, unspecified type -     Discontinue: hydrochlorothiazide (HYDRODIURIL) 12.5 MG tablet; Take 1 tablet (12.5 mg total) by mouth daily. Increased HCTZ 25 mg daily -     hydrochlorothiazide (HYDRODIURIL) 25 MG tablet; Take 1 tablet (25 mg total) by mouth daily.  Anemia, unspecified type -     CBC with Differential  Hyperlipidemia, unspecified hyperlipidemia type -     Lipid Panel  Colon cancer screening -     Fecal occult blood, imunochemical; Future   Meds ordered this encounter  Medications  . hydrochlorothiazide (HYDRODIURIL) 12.5 MG tablet    Sig: Take 1 tablet (12.5 mg total) by mouth daily.    Dispense:  90 tablet    Refill:  1    Follow-up: Return in about 6 months (around 06/18/2021) for bp/chol.    Kerin Perna, NP

## 2020-12-19 LAB — CBC WITH DIFFERENTIAL/PLATELET
Basophils Absolute: 0.1 10*3/uL (ref 0.0–0.2)
Basos: 1 %
EOS (ABSOLUTE): 0.4 10*3/uL (ref 0.0–0.4)
Eos: 6 %
Hematocrit: 39.1 % (ref 34.0–46.6)
Hemoglobin: 12.4 g/dL (ref 11.1–15.9)
Immature Grans (Abs): 0 10*3/uL (ref 0.0–0.1)
Immature Granulocytes: 0 %
Lymphocytes Absolute: 2.3 10*3/uL (ref 0.7–3.1)
Lymphs: 33 %
MCH: 23.5 pg — ABNORMAL LOW (ref 26.6–33.0)
MCHC: 31.7 g/dL (ref 31.5–35.7)
MCV: 74 fL — ABNORMAL LOW (ref 79–97)
Monocytes Absolute: 0.8 10*3/uL (ref 0.1–0.9)
Monocytes: 12 %
Neutrophils Absolute: 3.4 10*3/uL (ref 1.4–7.0)
Neutrophils: 48 %
Platelets: 309 10*3/uL (ref 150–450)
RBC: 5.27 x10E6/uL (ref 3.77–5.28)
RDW: 14.5 % (ref 11.7–15.4)
WBC: 7 10*3/uL (ref 3.4–10.8)

## 2020-12-19 LAB — LIPID PANEL
Chol/HDL Ratio: 2.6 ratio (ref 0.0–4.4)
Cholesterol, Total: 176 mg/dL (ref 100–199)
HDL: 67 mg/dL (ref >39)
LDL Chol Calc (NIH): 99 mg/dL (ref 0–99)
Triglycerides: 51 mg/dL (ref 0–149)
VLDL Cholesterol Cal: 10 mg/dL (ref 5–40)

## 2020-12-21 ENCOUNTER — Other Ambulatory Visit (INDEPENDENT_AMBULATORY_CARE_PROVIDER_SITE_OTHER): Payer: Self-pay | Admitting: Primary Care

## 2020-12-21 DIAGNOSIS — I10 Essential (primary) hypertension: Secondary | ICD-10-CM

## 2020-12-21 MED ORDER — PRAVASTATIN SODIUM 10 MG PO TABS: 10.0000 mg | ORAL_TABLET | Freq: Every day | ORAL | 1 refills | Status: DC

## 2021-06-18 ENCOUNTER — Ambulatory Visit (INDEPENDENT_AMBULATORY_CARE_PROVIDER_SITE_OTHER): Payer: No Typology Code available for payment source | Admitting: Primary Care

## 2021-07-09 ENCOUNTER — Other Ambulatory Visit: Payer: Self-pay

## 2021-07-09 ENCOUNTER — Ambulatory Visit (INDEPENDENT_AMBULATORY_CARE_PROVIDER_SITE_OTHER): Payer: Self-pay | Admitting: Primary Care

## 2021-07-09 ENCOUNTER — Encounter (INDEPENDENT_AMBULATORY_CARE_PROVIDER_SITE_OTHER): Payer: Self-pay | Admitting: Primary Care

## 2021-07-09 VITALS — BP 120/75 | HR 69 | Temp 97.2°F | Ht 66.0 in | Wt 265.2 lb

## 2021-07-09 DIAGNOSIS — I10 Essential (primary) hypertension: Secondary | ICD-10-CM

## 2021-07-09 DIAGNOSIS — D500 Iron deficiency anemia secondary to blood loss (chronic): Secondary | ICD-10-CM

## 2021-07-09 DIAGNOSIS — E785 Hyperlipidemia, unspecified: Secondary | ICD-10-CM

## 2021-07-09 MED ORDER — PRAVASTATIN SODIUM 10 MG PO TABS: 10.0000 mg | ORAL_TABLET | Freq: Every day | ORAL | 1 refills | Status: DC

## 2021-07-09 MED ORDER — HYDROCHLOROTHIAZIDE 25 MG PO TABS: 25.0000 mg | ORAL_TABLET | Freq: Every day | ORAL | 1 refills | Status: DC

## 2021-07-09 NOTE — Progress Notes (Signed)
Subjective:    Patient here for follow-up of elevated blood pressure.  She is exercising and is adherent to a low-salt diet.  Blood pressure is well controlled at home. (735- 329 systolic and diastolic 74- 86 )Cardiac symptoms: palpitations and Afib . Patient denies: irregular heart beat. Cardiovascular risk factors: dyslipidemia, hypertension, and obesity (BMI >= 30 kg/m2). Use of agents associated with hypertension: NSAIDS. History of target organ damage: none.  The following portions of the patient's history were reviewed and updated as appropriate: allergies, current medications, past family history, past medical history, past social history, past surgical history, and problem list.  Review of Systems Pertinent items noted in HPI and remainder of comprehensive ROS otherwise negative.     Objective:   Vitals:   07/09/21 1104  BP: 120/75  Pulse: 69  Temp: (!) 97.2 F (36.2 C)  TempSrc: Temporal  SpO2: 94%  Weight: 265 lb 3.2 oz (120.3 kg)  Height: '5\' 6"'  (1.676 m)   General: Vital signs reviewed.  Patient is well-developed and well-nourished, morbid obese female in no acute distress and cooperative with exam.  Head: Normocephalic and atraumatic. Eyes: EOMI, conjunctivae normal, no scleral icterus.  Neck: Supple, trachea midline, normal ROM, no JVD, masses, thyromegaly, or carotid bruit present.  Cardiovascular: RRR, S1 normal, S2 normal, no murmurs, gallops, or rubs. Pulmonary/Chest: Clear to auscultation bilaterally, no wheezes, rales, or rhonchi. Abdominal: Soft, non-tender, non-distended, BS +, no masses, organomegaly, or guarding present.  Musculoskeletal: No joint deformities, erythema, or stiffness, ROM full and nontender. Extremities: No lower extremity edema bilaterally,  pulses symmetric and intact bilaterally. No cyanosis or clubbing. Neurological: A&O x3, Strength is normal and symmetric bilaterally,no focal motor deficit, sensory intact to light touch bilaterally.  Skin:  Warm, dry and intact. No rashes or erythema. Psychiatric: Normal mood and affect. speech and behavior is normal. Cognition and memory are normal.      Assessment:    Hypertension, normal blood pressure . Evidence of target organ damage: none.    Plan:    Dietary sodium restriction. Regular aerobic exercise.   Iron deficiency anemia due to chronic blood loss -     CBC with Differential/Platelet; Future  Hypertension, unspecified type -     hydrochlorothiazide (HYDRODIURIL) 25 MG tablet; Take 1 tablet (25 mg total) by mouth daily. -     pravastatin (PRAVACHOL) 10 MG tablet; Take 1 tablet (10 mg total) by mouth daily. APPOINTMENT NEEDED FOR ADDITIONAL REFILLS -     CMP14+EGFR; Future  Hyperlipidemia, unspecified hyperlipidemia type -     Lipid panel; Future

## 2021-07-09 NOTE — Patient Instructions (Signed)

## 2021-07-15 ENCOUNTER — Other Ambulatory Visit (INDEPENDENT_AMBULATORY_CARE_PROVIDER_SITE_OTHER): Payer: Self-pay

## 2021-07-15 ENCOUNTER — Other Ambulatory Visit: Payer: Self-pay

## 2021-07-15 DIAGNOSIS — I10 Essential (primary) hypertension: Secondary | ICD-10-CM

## 2021-07-15 DIAGNOSIS — E785 Hyperlipidemia, unspecified: Secondary | ICD-10-CM

## 2021-07-15 DIAGNOSIS — D500 Iron deficiency anemia secondary to blood loss (chronic): Secondary | ICD-10-CM

## 2021-07-16 LAB — CBC WITH DIFFERENTIAL/PLATELET
Basophils Absolute: 0.1 10*3/uL (ref 0.0–0.2)
Basos: 1 %
EOS (ABSOLUTE): 0.3 10*3/uL (ref 0.0–0.4)
Eos: 5 %
Hematocrit: 40.5 % (ref 34.0–46.6)
Hemoglobin: 12.3 g/dL (ref 11.1–15.9)
Immature Grans (Abs): 0 10*3/uL (ref 0.0–0.1)
Immature Granulocytes: 0 %
Lymphocytes Absolute: 2.3 10*3/uL (ref 0.7–3.1)
Lymphs: 34 %
MCH: 22.9 pg — ABNORMAL LOW (ref 26.6–33.0)
MCHC: 30.4 g/dL — ABNORMAL LOW (ref 31.5–35.7)
MCV: 75 fL — ABNORMAL LOW (ref 79–97)
Monocytes Absolute: 0.7 10*3/uL (ref 0.1–0.9)
Monocytes: 10 %
Neutrophils Absolute: 3.4 10*3/uL (ref 1.4–7.0)
Neutrophils: 50 %
Platelets: 284 10*3/uL (ref 150–450)
RBC: 5.38 x10E6/uL — ABNORMAL HIGH (ref 3.77–5.28)
RDW: 15.1 % (ref 11.7–15.4)
WBC: 6.8 10*3/uL (ref 3.4–10.8)

## 2021-07-16 LAB — CMP14+EGFR
ALT: 17 IU/L (ref 0–32)
AST: 18 IU/L (ref 0–40)
Albumin/Globulin Ratio: 1.3 (ref 1.2–2.2)
Albumin: 4.3 g/dL (ref 3.8–4.9)
Alkaline Phosphatase: 101 IU/L (ref 44–121)
BUN/Creatinine Ratio: 12 (ref 9–23)
BUN: 11 mg/dL (ref 6–24)
Bilirubin Total: 0.3 mg/dL (ref 0.0–1.2)
CO2: 27 mmol/L (ref 20–29)
Calcium: 9.3 mg/dL (ref 8.7–10.2)
Chloride: 100 mmol/L (ref 96–106)
Creatinine, Ser: 0.9 mg/dL (ref 0.57–1.00)
Globulin, Total: 3.2 g/dL (ref 1.5–4.5)
Glucose: 108 mg/dL — ABNORMAL HIGH (ref 65–99)
Potassium: 4.1 mmol/L (ref 3.5–5.2)
Sodium: 140 mmol/L (ref 134–144)
Total Protein: 7.5 g/dL (ref 6.0–8.5)
eGFR: 76 mL/min/{1.73_m2} (ref >59)

## 2021-07-16 LAB — LIPID PANEL
Chol/HDL Ratio: 2.4 ratio (ref 0.0–4.4)
Cholesterol, Total: 183 mg/dL (ref 100–199)
HDL: 76 mg/dL (ref >39)
LDL Chol Calc (NIH): 97 mg/dL (ref 0–99)
Triglycerides: 51 mg/dL (ref 0–149)
VLDL Cholesterol Cal: 10 mg/dL (ref 5–40)

## 2021-10-07 ENCOUNTER — Other Ambulatory Visit: Payer: Self-pay

## 2021-10-07 DIAGNOSIS — Z1231 Encounter for screening mammogram for malignant neoplasm of breast: Secondary | ICD-10-CM

## 2021-10-07 NOTE — Progress Notes (Signed)
12.31  

## 2021-12-01 ENCOUNTER — Ambulatory Visit: Payer: No Typology Code available for payment source

## 2022-01-13 ENCOUNTER — Ambulatory Visit (INDEPENDENT_AMBULATORY_CARE_PROVIDER_SITE_OTHER): Payer: No Typology Code available for payment source | Admitting: Primary Care

## 2022-01-26 ENCOUNTER — Ambulatory Visit
Admission: RE | Admit: 2022-01-26 | Discharge: 2022-01-26 | Disposition: A | Discharge status: Home / Self Care | Payer: No Typology Code available for payment source | Source: Ambulatory Visit | Attending: Obstetrics and Gynecology | Admitting: Obstetrics and Gynecology

## 2022-01-26 ENCOUNTER — Ambulatory Visit: Payer: Self-pay | Admitting: *Deleted

## 2022-01-26 ENCOUNTER — Other Ambulatory Visit: Payer: Self-pay

## 2022-01-26 VITALS — BP 130/90 | Wt 267.0 lb

## 2022-01-26 DIAGNOSIS — Z1231 Encounter for screening mammogram for malignant neoplasm of breast: Secondary | ICD-10-CM

## 2022-01-26 DIAGNOSIS — Z1239 Encounter for other screening for malignant neoplasm of breast: Secondary | ICD-10-CM

## 2022-01-26 DIAGNOSIS — Z1211 Encounter for screening for malignant neoplasm of colon: Secondary | ICD-10-CM

## 2022-01-26 NOTE — Patient Instructions (Signed)
Explained breast self awareness with [name]. Patient did not need a Pap smear today due to patient has a history of a hysterectomy for benign reasons. Let patient know that she doesn't need any further Pap smears due to her history of a hysterectomy for benign reasons. Referred patient to the [facility] for a screening mammogram on mobile unit. Appointment scheduled Tuesday, January 26, 2022 at 1140. Patient aware of appointment and will be there. Let patient know the [facility] will follow up with her within the next couple weeks with results of her mammogram by letter or phone. Christina Stanley verbalized understanding.  Lucresia Simic, Arvil Chaco, RN 11:13 AM

## 2022-01-26 NOTE — Progress Notes (Signed)
[name] is a 56 y.o. female who presents to [facility] clinic today with complaint of left diffuse breast pain x 1 day three months ago that went away.    Pap Smear: Pap smear not completed today. Last Pap smear was 09/14/2012 at the [facility] for Christina Stanley at [facility] and normal. Per patient has no history of an abnormal Pap smear. Patient has a history of a hysterectomy 09/20/2018 due to AUB and fibroids. Patient no longer needs Pap smears due to her history of a hysterectomy for benign reasons per BCCCP and ASCCP guidelines. Last Pap smear result is available in Epic.   Physical exam: Breasts Breasts symmetrical. No skin abnormalities bilateral breasts. No nipple retraction bilateral breasts. No nipple discharge bilateral breasts. No lymphadenopathy. No lumps palpated bilateral breasts. No complaints of pain or tenderness on exam.  MS DIGITAL SCREENING TOMO BILATERAL  Result Date: 01/05/2019 CLINICAL DATA:  Screening. EXAM: DIGITAL SCREENING BILATERAL MAMMOGRAM WITH TOMO AND CAD COMPARISON:  Previous exam(s). ACR Breast Density Category b: There are scattered areas of fibroglandular density. FINDINGS: In the left breast, 2 masses warrant further evaluation. In the right breast, no findings suspicious for malignancy. Images were processed with CAD. IMPRESSION: Further evaluation is suggested for 2 masses in the left breast. RECOMMENDATION: Ultrasound of the left breast. (Code:[id number]) The patient will be contacted regarding the findings, and additional imaging will be scheduled. BI-RADS CATEGORY  0: Incomplete. Need additional imaging evaluation and/or prior mammograms for comparison. Electronically Signed   By: Marin Olp M.D.   On: 01/05/2019 10:49   MS DIGITAL DIAG TOMO BILAT  Result Date: 10/07/2020 CLINICAL DATA:  56 year old female presenting for evaluation of pain in the superior left breast. She describes a shooting pain which is intermittent in the left  breast that has been occurring for years. She is not having the pain today. She also has pain in the superior right breast which is more achy, also intermittent and chronic. EXAM: DIGITAL DIAGNOSTIC BILATERAL MAMMOGRAM WITH TOMO AND CAD COMPARISON:  Previous exam(s). ACR Breast Density Category a: The breast tissue is almost entirely fatty. FINDINGS: No suspicious calcifications, masses or areas of distortion are seen in the bilateral breasts. Mammographic images were processed with CAD. IMPRESSION: 1. There are no suspicious mammographic findings in either breast to correspond with the patient's chronic intermittent bilateral breast pain. 2.  No mammographic evidence of malignancy in the bilateral breasts. RECOMMENDATION: Screening mammogram in one year.(Code:[id number]-01Y) I have discussed the findings and recommendations with the patient. If applicable, a reminder letter will be sent to the patient regarding the next appointment. BI-RADS CATEGORY  1: Negative. Electronically Signed   By: Ammie Ferrier M.D.   On: 10/07/2020 14:17        Pelvic/Bimanual Pap is not indicated today per BCCCP guidelines.   Smoking History: Patient has never smoked.   Patient Navigation: Patient education provided. Access to services provided for patient through [facility] program.   Colorectal Cancer Screening: Per patient had a colonoscopy completed 13-14 years ago. FIT Test completed 01/16/2019 that was negative. FIT Test given to patient today to complete. No complaints today.    Breast and Cervical Cancer Risk Assessment: Patient does not have family history of breast cancer, known genetic mutations, or radiation treatment to the chest before age 41. Patient does not have history of cervical dysplasia, immunocompromised, or DES exposure in-utero.  Risk Assessment     Risk Scores       01/26/2022  10/07/2020   Last edited by: Loletta Parish, RN McGill, Karlton Lemon, LPN   5-year risk: 1.4 % 1.3 %   Lifetime risk:  7.8 % 8.2 %           A: BCCCP exam without pap smear No complaints today.  P: Referred patient to the [facility] for a screening mammogram on mobile unit. Appointment scheduled Tuesday, January 26, 2022 at 1140.  Loletta Parish, RN 01/26/2022 11:13 AM

## 2022-02-09 ENCOUNTER — Ambulatory Visit (INDEPENDENT_AMBULATORY_CARE_PROVIDER_SITE_OTHER): Payer: No Typology Code available for payment source | Admitting: Primary Care

## 2022-02-10 ENCOUNTER — Telehealth (INDEPENDENT_AMBULATORY_CARE_PROVIDER_SITE_OTHER): Payer: Self-pay | Admitting: *Deleted

## 2022-02-10 NOTE — Telephone Encounter (Signed)
Copied from [facility] [id number]. Topic: General - Other >> Feb 08, 2022  2:58 PM Tessa Lerner A wrote: Reason for CRM: The patient has returned a missed call form the practice  Please contact further when possible

## 2022-02-11 ENCOUNTER — Other Ambulatory Visit: Payer: Self-pay

## 2022-02-11 ENCOUNTER — Encounter (INDEPENDENT_AMBULATORY_CARE_PROVIDER_SITE_OTHER): Payer: Self-pay | Admitting: Primary Care

## 2022-02-11 ENCOUNTER — Ambulatory Visit (INDEPENDENT_AMBULATORY_CARE_PROVIDER_SITE_OTHER): Payer: Self-pay | Admitting: Primary Care

## 2022-02-11 VITALS — BP 134/88 | HR 65 | Temp 97.6°F | Ht 66.0 in | Wt 254.0 lb

## 2022-02-11 DIAGNOSIS — E785 Hyperlipidemia, unspecified: Secondary | ICD-10-CM

## 2022-02-11 DIAGNOSIS — Z862 Personal history of diseases of the blood and blood-forming organs and certain disorders involving the immune mechanism: Secondary | ICD-10-CM

## 2022-02-11 DIAGNOSIS — I10 Essential (primary) hypertension: Secondary | ICD-10-CM

## 2022-02-11 MED ORDER — HYDROCHLOROTHIAZIDE 25 MG PO TABS: 25.0000 mg | ORAL_TABLET | Freq: Every day | ORAL | 1 refills | Status: DC

## 2022-02-11 NOTE — Patient Instructions (Signed)

## 2022-02-12 LAB — CMP14+EGFR
ALT: 12 IU/L (ref 0–32)
AST: 15 IU/L (ref 0–40)
Albumin/Globulin Ratio: 1.4 (ref 1.2–2.2)
Albumin: 4.4 g/dL (ref 3.8–4.9)
Alkaline Phosphatase: 98 IU/L (ref 44–121)
BUN/Creatinine Ratio: 10 (ref 9–23)
BUN: 9 mg/dL (ref 6–24)
Bilirubin Total: 0.4 mg/dL (ref 0.0–1.2)
CO2: 27 mmol/L (ref 20–29)
Calcium: 9.5 mg/dL (ref 8.7–10.2)
Chloride: 100 mmol/L (ref 96–106)
Creatinine, Ser: 0.87 mg/dL (ref 0.57–1.00)
Globulin, Total: 3.2 g/dL (ref 1.5–4.5)
Glucose: 96 mg/dL (ref 70–99)
Potassium: 4.4 mmol/L (ref 3.5–5.2)
Sodium: 138 mmol/L (ref 134–144)
Total Protein: 7.6 g/dL (ref 6.0–8.5)
eGFR: 79 mL/min/{1.73_m2} (ref >59)

## 2022-02-12 LAB — LIPID PANEL
Chol/HDL Ratio: 2.8 ratio (ref 0.0–4.4)
Cholesterol, Total: 179 mg/dL (ref 100–199)
HDL: 63 mg/dL (ref >39)
LDL Chol Calc (NIH): 105 mg/dL — ABNORMAL HIGH (ref 0–99)
Triglycerides: 58 mg/dL (ref 0–149)
VLDL Cholesterol Cal: 11 mg/dL (ref 5–40)

## 2022-02-12 LAB — CBC WITH DIFFERENTIAL/PLATELET
Basophils Absolute: 0 10*3/uL (ref 0.0–0.2)
Basos: 1 %
EOS (ABSOLUTE): 0.2 10*3/uL (ref 0.0–0.4)
Eos: 3 %
Hematocrit: 40.5 % (ref 34.0–46.6)
Hemoglobin: 12.8 g/dL (ref 11.1–15.9)
Immature Grans (Abs): 0 10*3/uL (ref 0.0–0.1)
Immature Granulocytes: 0 %
Lymphocytes Absolute: 2.1 10*3/uL (ref 0.7–3.1)
Lymphs: 33 %
MCH: 23.2 pg — ABNORMAL LOW (ref 26.6–33.0)
MCHC: 31.6 g/dL (ref 31.5–35.7)
MCV: 74 fL — ABNORMAL LOW (ref 79–97)
Monocytes Absolute: 0.6 10*3/uL (ref 0.1–0.9)
Monocytes: 10 %
Neutrophils Absolute: 3.3 10*3/uL (ref 1.4–7.0)
Neutrophils: 53 %
Platelets: 307 10*3/uL (ref 150–450)
RBC: 5.51 x10E6/uL — ABNORMAL HIGH (ref 3.77–5.28)
RDW: 14.2 % (ref 11.7–15.4)
WBC: 6.3 10*3/uL (ref 3.4–10.8)

## 2022-02-14 NOTE — Progress Notes (Signed)
Established Patient Office Visit  Subjective:  Patient ID: Christina Stanley, female    DOB: 1966-03-02  Age: 56 y.o. MRN: [id number]  CC:  Chief Complaint  Patient presents with   Hypertension   Hyperlipidemia    HPI [name] is a 56 year old morbid obese female who presents for the management of HTN-Denies shortness of breath, headaches, chest pain or lower extremity edema . Hyperlipidemia-  follow up visit. Patient has No headache, No chest pain, No abdominal pain - No Nausea, No new weakness tingling or numbness, No Cough - SOB  Past Medical History:  Diagnosis Date   Anemia    Fibroids    Hyperlipidemia    Hypertension     Past Surgical History:  Procedure Laterality Date   DILATION AND CURETTAGE OF UTERUS N/A 07/06/2018   Procedure: VAGINAL MUCOSA LACERATION REPAIR;  Surgeon: Chancy Milroy, MD;  Location: [facility];  Service: Gynecology;  Laterality: N/A;   ECTOPIC PREGNANCY SURGERY     two pregnancy   SALPINGECTOMY     TOTAL ABDOMINAL HYSTERECTOMY W/ BILATERAL SALPINGOOPHORECTOMY  08/23/2018   UTERINE FIBROID SURGERY  08/12/2010   diagnositic hysterscopy with D&C and removal of central fibroid.     Family History  Problem Relation Age of Onset   Diabetes Mother    Hypertension Mother    Cancer Father        colon   Diabetes Father    Hypertension Father    Diabetes Sister    Hypertension Sister    Cancer Maternal Grandmother    Breast cancer Neg Hx     Social History   Socioeconomic History   Marital status: Single    Spouse name: Not on file   Number of children: 1   Years of education: Not on file   Highest education level: High school graduate  Occupational History   Not on file  Tobacco Use   Smoking status: Never   Smokeless tobacco: Never  Vaping Use   Vaping Use: Never used  Substance and Sexual Activity   Alcohol use: Not Currently    Comment: ocassional   Drug use: No   Sexual activity: Yes    Birth control/protection:  Surgical  Other Topics Concern   Not on file  Social History Narrative   Not on file   Social Determinants of Health   Financial Resource Strain: Not on file  Food Insecurity: No Food Insecurity   Worried About Running Out of Food in the Last Year: Never true   Ran Out of Food in the Last Year: Never true  Transportation Needs: No Transportation Needs   Lack of Transportation (Medical): No   Lack of Transportation (Non-Medical): No  Physical Activity: Not on file  Stress: Not on file  Social Connections: Not on file  Intimate Partner Violence: Not on file    Outpatient Medications Prior to Visit  Medication Sig Dispense Refill   hydrochlorothiazide (HYDRODIURIL) 25 MG tablet Take 1 tablet (25 mg total) by mouth daily. 90 tablet 1   pravastatin (PRAVACHOL) 10 MG tablet Take 1 tablet (10 mg total) by mouth daily. APPOINTMENT NEEDED FOR ADDITIONAL REFILLS (Patient not taking: Reported on 02/11/2022) 30 tablet 1   No facility-administered medications prior to visit.    Allergies  Allergen Reactions   Amoxicillin Rash    Has patient had a PCN reaction causing immediate rash, facial/tongue/throat swelling, SOB or lightheadedness with hypotension: No Has patient had a PCN reaction  causing severe rash involving mucus membranes or skin necrosis: No Has patient had a PCN reaction that required hospitalization: No Has patient had a PCN reaction occurring within the last 10 years: No If all of the above answers are "NO", then may proceed with Cephalosporin use.     ROS Comprehensive ROS Pertinent positive and negative noted in HPI      Objective:   BP 134/88 (BP Location: Right Arm, Patient Position: Sitting, Cuff Size: Large)    Pulse 65    Temp 97.6 F (36.4 C) (Oral)    Ht '5\' 6"'  (1.676 m)    Wt 254 lb (115.2 kg)    LMP  (LMP Unknown)    SpO2 99%    BMI 41.00 kg/m   Physical Exam Vitals reviewed.  Constitutional:      Appearance: She is obese.  HENT:     Head: Normocephalic.      Right Ear: Tympanic membrane and external ear normal.     Left Ear: External ear normal.     Nose: Nose normal.  Eyes:     Extraocular Movements: Extraocular movements intact.     Pupils: Pupils are equal, round, and reactive to light.  Cardiovascular:     Rate and Rhythm: Normal rate and regular rhythm.  Pulmonary:     Effort: Pulmonary effort is normal.     Breath sounds: Normal breath sounds.  Abdominal:     General: Bowel sounds are normal. There is distension.     Palpations: Abdomen is soft.  Musculoskeletal:        General: Normal range of motion.     Cervical back: Normal range of motion and neck supple.  Skin:    General: Skin is warm and dry.  Neurological:     Mental Status: She is alert and oriented to person, place, and time.  Psychiatric:        Mood and Affect: Mood normal.        Behavior: Behavior normal.        Thought Content: Thought content normal.        Judgment: Judgment normal.   BP 134/88 (BP Location: Right Arm, Patient Position: Sitting, Cuff Size: Large)    Pulse 65    Temp 97.6 F (36.4 C) (Oral)    Ht '5\' 6"'  (1.676 m)    Wt 254 lb (115.2 kg)    LMP  (LMP Unknown)    SpO2 99%    BMI 41.00 kg/m  Wt Readings from Last 3 Encounters:  02/11/22 254 lb (115.2 kg)  01/26/22 267 lb (121.1 kg)  07/09/21 265 lb 3.2 oz (120.3 kg)   Health Maintenance Due  Topic Date Due   COVID-19 Vaccine (1) Never done   Fecal DNA (Cologuard)  Never done    There are no preventive care reminders to display for this patient.  Lab Results  Component Value Date   TSH 2.715 07/05/2018   Lab Results  Component Value Date   WBC 6.3 02/11/2022   HGB 12.8 02/11/2022   HCT 40.5 02/11/2022   MCV 74 (L) 02/11/2022   PLT 307 02/11/2022   Lab Results  Component Value Date   NA 138 02/11/2022   K 4.4 02/11/2022   CO2 27 02/11/2022   GLUCOSE 96 02/11/2022   BUN 9 02/11/2022   CREATININE 0.87 02/11/2022   BILITOT 0.4 02/11/2022   ALKPHOS 98 02/11/2022   AST 15  02/11/2022   ALT 12 02/11/2022   PROT  7.6 02/11/2022   ALBUMIN 4.4 02/11/2022   CALCIUM 9.5 02/11/2022   ANIONGAP 8 07/05/2018   EGFR 79 02/11/2022   Lab Results  Component Value Date   CHOL 179 02/11/2022   Lab Results  Component Value Date   HDL 63 02/11/2022   Lab Results  Component Value Date   LDLCALC 105 (H) 02/11/2022   Lab Results  Component Value Date   TRIG 58 02/11/2022   Lab Results  Component Value Date   CHOLHDL 2.8 02/11/2022   Lab Results  Component Value Date   HGBA1C 5.6 12/14/2018      Assessment & Plan:  Christina Stanley was seen today for hypertension and hyperlipidemia.  Diagnoses and all orders for this visit:  Hypertension, unspecified type We have discussed target BP range and blood pressure goal. I have advised patient to check BP regularly and to call us back or report to clinic if the numbers are consistently higher than 140/90. We discussed the importance of compliance with medical therapy and DASH diet recommended, consequences of uncontrolled hypertension discussed.  - continue current BP medications  -     CMP14+EGFR -     hydrochlorothiazide (HYDRODIURIL) 25 MG tablet; Take 1 tablet (25 mg total) by mouth daily.  Hyperlipidemia, unspecified hyperlipidemia type We discussed  healthy lifestyle diet of fruits vegetables fish nuts whole grains and low saturated fat . Foods high in cholesterol or liver, fatty meats,cheese, butter avocados, nuts and seeds, chocolate and fried foods. -     Lipid panel  History of anemia -     CBC with Differential/Platelet  Morbid obesity (HCC) Discussed diet and exercise for person with BMI >25. Instructed: You must burn more calories than you eat. Losing 5 percent of your body weight should be considered a success. In the longer term, losing more than 15 percent of your body weight and staying at this weight is an extremely good result. However, keep in mind that even losing 5 percent of your body weight leads to  important health benefits, so try not to get discouraged if you're not able to lose more than this. Will recheck weight in 3-6 months.      Meds ordered this encounter  Medications   hydrochlorothiazide (HYDRODIURIL) 25 MG tablet    Sig: Take 1 tablet (25 mg total) by mouth daily.    Dispense:  90 tablet    Refill:  1    Follow-up: Return in 3 months (on 05/11/2022) for Bp/f/u.    Kerin Perna, NP

## 2022-05-11 ENCOUNTER — Ambulatory Visit (INDEPENDENT_AMBULATORY_CARE_PROVIDER_SITE_OTHER): Payer: Self-pay | Admitting: Primary Care

## 2022-05-31 ENCOUNTER — Other Ambulatory Visit (HOSPITAL_BASED_OUTPATIENT_CLINIC_OR_DEPARTMENT_OTHER): Payer: Self-pay

## 2022-05-31 ENCOUNTER — Encounter (INDEPENDENT_AMBULATORY_CARE_PROVIDER_SITE_OTHER): Payer: Self-pay | Admitting: Primary Care

## 2022-05-31 ENCOUNTER — Ambulatory Visit (INDEPENDENT_AMBULATORY_CARE_PROVIDER_SITE_OTHER): Payer: Self-pay | Admitting: Primary Care

## 2022-05-31 VITALS — BP 134/89 | HR 74 | Temp 97.9°F | Ht 66.0 in | Wt 261.0 lb

## 2022-05-31 DIAGNOSIS — I10 Essential (primary) hypertension: Secondary | ICD-10-CM

## 2022-05-31 DIAGNOSIS — R21 Rash and other nonspecific skin eruption: Secondary | ICD-10-CM

## 2022-05-31 MED ORDER — LORATADINE 10 MG PO TABS
10.0000 mg | ORAL_TABLET | Freq: Every day | ORAL | 1 refills | Status: DC
  Filled 2022-05-31: qty 90, 90d supply, fill #0

## 2022-05-31 MED ORDER — EPINEPHRINE 0.3 MG/0.3ML IJ SOAJ
0.3000 mg | INTRAMUSCULAR | 1 refills | Status: DC | PRN
  Filled 2022-05-31: qty 1, fill #0

## 2022-05-31 MED ORDER — HYDROCHLOROTHIAZIDE 25 MG PO TABS: 25.0000 mg | ORAL_TABLET | Freq: Every day | ORAL | 1 refills | Status: DC

## 2022-05-31 NOTE — Progress Notes (Signed)
Christina Stanley, is a 56 y.o. female  VEH:[id number]  EZM:[id number]  DOB - Mar 23, 1966  Chief Complaint  Patient presents with   Hypertension    Pt states HCTZ is causing her to breakout        Subjective:   Christina Stanley is a 56 y.o. female here today for a follow up for HTN and she randomly has breakouts of rashes and whelps.(The last six months).  Denies changes in soaps, deodorants, laundry detergent or environmental- grass, weeds and trees. Patient has No headache, No chest pain, No abdominal pain - No Nausea, No new weakness tingling or numbness, No Cough - shortness of breath  No problems updated.  Allergies  Allergen Reactions   Egg Shells Swelling    Face and lips swelled.   Shellfish Allergy Swelling    Face and lips swelled   Amoxicillin Rash    Has patient had a PCN reaction causing immediate rash, facial/tongue/throat swelling, SOB or lightheadedness with hypotension: No Has patient had a PCN reaction causing severe rash involving mucus membranes or skin necrosis: No Has patient had a PCN reaction that required hospitalization: No Has patient had a PCN reaction occurring within the last 10 years: No If all of the above answers are "NO", then may proceed with Cephalosporin use.     Past Medical History:  Diagnosis Date   Anemia    Fibroids    Hyperlipidemia    Hypertension     Current Outpatient Medications on File Prior to Visit  Medication Sig Dispense Refill   hydrochlorothiazide (HYDRODIURIL) 25 MG tablet Take 1 tablet (25 mg total) by mouth daily. 90 tablet 1   pravastatin (PRAVACHOL) 10 MG tablet Take 1 tablet (10 mg total) by mouth daily. APPOINTMENT NEEDED FOR ADDITIONAL REFILLS (Patient not taking: Reported on 02/11/2022) 30 tablet 1   No current facility-administered medications on file prior to visit.    Objective:   Vitals:   05/31/22 1633  BP: 134/89  Pulse: 74  Temp: 97.9 F (36.6 C)  TempSrc: Oral  SpO2: 96%   Weight: 261 lb (118.4 kg)  Height: '5\' 6"'$  (1.676 m)    Exam General appearance : Awake, alert, not in any distress. Speech Clear. Not toxic looking HEENT: Atraumatic and Normocephalic, pupils equally reactive to light and accomodation Neck: Supple, no JVD. No cervical lymphadenopathy.  Chest: Good air entry bilaterally, no added sounds  CVS: S1 S2 regular, no murmurs.  Abdomen: Bowel sounds present, Non tender and not distended with no gaurding, rigidity or rebound. Extremities: B/L Lower Ext shows no edema, both legs are warm to touch Neurology: Awake alert, and oriented X 3, CN II-XII intact, Non focal Skin: No Rash  Data Review Lab Results  Component Value Date   HGBA1C 5.6 12/14/2018    Assessment & Plan  Nathan was seen today for hypertension.  Diagnoses and all orders for this visit:  Hypertension, unspecified type BP goal - < 130/80 ( slightly elevated forgot to take her Bp medication . Explained that having normal blood pressure is the goal and medications are helping to get to goal and maintain normal blood pressure. DIET: Limit salt intake, read nutrition labels to check salt content, limit fried and high fatty foods  Avoid using multisymptom OTC cold preparations that generally contain sudafed which can rise BP. Consult with pharmacist on best cold relief products to use for persons with HTN EXERCISE Discussed incorporating exercise such as walking - 30 minutes most days  of the week and can do in 10 minute intervals     Rash and nonspecific skin eruption .Conservative treatment luke warm baths, Aveeno oatmeal bath or emollients. Medications can be discussed at this visit- antihistamine or pruritic medications.   There are no diagnoses linked to this encounter.   Patient have been counseled extensively about nutrition and exercise. Other issues discussed during this visit include: low cholesterol diet, weight control and daily exercise, foot care, annual eye  examinations at Ophthalmology, importance of adherence with medications and regular follow-up. We also discussed long term complications of uncontrolled diabetes and hypertension.   No follow-ups on file.  The patient was given clear instructions to go to ER or return to medical center if symptoms don't improve, worsen or new problems develop. The patient verbalized understanding. The patient was told to call to get lab results if they haven't heard anything in the next week.   This note has been created with Surveyor, quantity. Any transcriptional errors are unintentional.   Kerin Perna, NP 05/31/2022, 4:46 PM

## 2022-05-31 NOTE — Addendum Note (Signed)
Addended by: Juluis Mire on: 05/31/2022 04:56 PM   Modules accepted: Orders

## 2022-05-31 NOTE — Patient Instructions (Signed)
Rash, Adult A rash is a change in the color of your skin. A rash can also change the way your skin feels. There are many different conditions and factors that can cause a rash. Some rashes may disappear after a few days, but some may last for a few weeks. Common causes of rashes include: Viral infections, such as: Colds. Measles. Hand, foot, and mouth disease. Bacterial infections, such as: Scarlet fever. Impetigo. Fungal infections, such as Candida. Allergic reactions to food, medicines, or skin care products. Follow these instructions at home: The goal of treatment is to stop the itching and keep the rash from spreading. Pay attention to any changes in your symptoms. Follow these instructions to help with your condition: Medicine Take or apply over-the-counter and prescription medicines only as told by your health care provider. These may include: Corticosteroid creams to treat red or swollen skin. Anti-itch lotions. Oral allergy medicines (antihistamines). Oral corticosteroids for severe symptoms.  Skin care Apply cool compresses to the affected areas. Do not scratch or rub your skin. Avoid covering the rash. Make sure the rash is exposed to air as much as possible. Managing itching and discomfort Avoid hot showers or baths, which can make itching worse. A cold shower may help. Try taking a bath with: Epsom salts. Follow manufacturer instructions on the packaging. You can get these at your local pharmacy or grocery store. Baking soda. Pour a small amount into the bath as told by your health care provider. Colloidal oatmeal. Follow manufacturer instructions on the packaging. You can get this at your local pharmacy or grocery store. Try applying baking soda paste to your skin. Stir water into baking soda until it reaches a paste-like consistency. Try applying calamine lotion. This is an over-the-counter lotion that helps to relieve itchiness. Keep cool and out of the sun. Sweating  and being hot can make itching worse. General instructions  Rest as needed. Drink enough fluid to keep your urine pale yellow. Wear loose-fitting clothing. Avoid scented soaps, detergents, and perfumes. Use gentle soaps, detergents, perfumes, and other cosmetic products. Avoid any substance that causes your rash. Keep a journal to help track what causes your rash. Write down: What you eat. What cosmetic products you use. What you drink. What you wear. This includes jewelry. Keep all follow-up visits as told by your health care provider. This is important. Contact a health care provider if: You sweat at night. You lose weight. You urinate more than normal. You urinate less than normal, or you notice that your urine is a darker color than usual. You feel weak. You vomit. Your skin or the whites of your eyes look yellow (jaundice). Your skin: Tingles. Is numb. Your rash: Does not go away after several days. Gets worse. You are: Unusually thirsty. More tired than normal. You have: New symptoms. Pain in your abdomen. A fever. Diarrhea. Get help right away if you: Have a fever and your symptoms suddenly get worse. Develop confusion. Have a severe headache or a stiff neck. Have severe joint pains or stiffness. Have a seizure. Develop a rash that covers all or most of your body. The rash may or may not be painful. Develop blisters that: Are on top of the rash. Grow larger or grow together. Are painful. Are inside your nose or mouth. Develop a rash that: Looks like purple pinprick-sized spots all over your body. Has a "bull's eye" or looks like a target. Is not related to sun exposure, is red and painful, and causes   your skin to peel. Summary A rash is a change in the color of your skin. Some rashes disappear after a few days, but some may last for a few weeks. The goal of treatment is to stop the itching and keep the rash from spreading. Take or apply over-the-counter  and prescription medicines only as told by your health care provider. Contact a health care provider if you have new or worsening symptoms. Keep all follow-up visits as told by your health care provider. This is important. This information is not intended to replace advice given to you by your health care provider. Make sure you discuss any questions you have with your health care provider. Document Revised: 09/24/2021 Document Reviewed: 09/24/2021 Elsevier Patient Education  2023 [employer] Inc.  

## 2022-07-21 ENCOUNTER — Ambulatory Visit: Payer: No Typology Code available for payment source | Admitting: Dermatology

## 2022-08-01 IMAGING — MG DIGITAL DIAGNOSTIC BILAT W/ TOMO W/ CAD
8 series · 8 of 24 positions shown · non-contrast
Comparison: Previous exam(s).

ACR Breast Density Category a: The breast tissue is almost entirely
fatty.

CLINICAL DATA: 53-year-old female presenting for evaluation of pain
in the superior left breast. She describes a shooting pain which is
intermittent in the left breast that has been occurring for years.
She is not having the pain today. She also has pain in the superior
right breast which is more achy, also intermittent and chronic.

EXAM:
DIGITAL DIAGNOSTIC BILATERAL MAMMOGRAM WITH TOMO AND CAD

[L CC synth-2D]
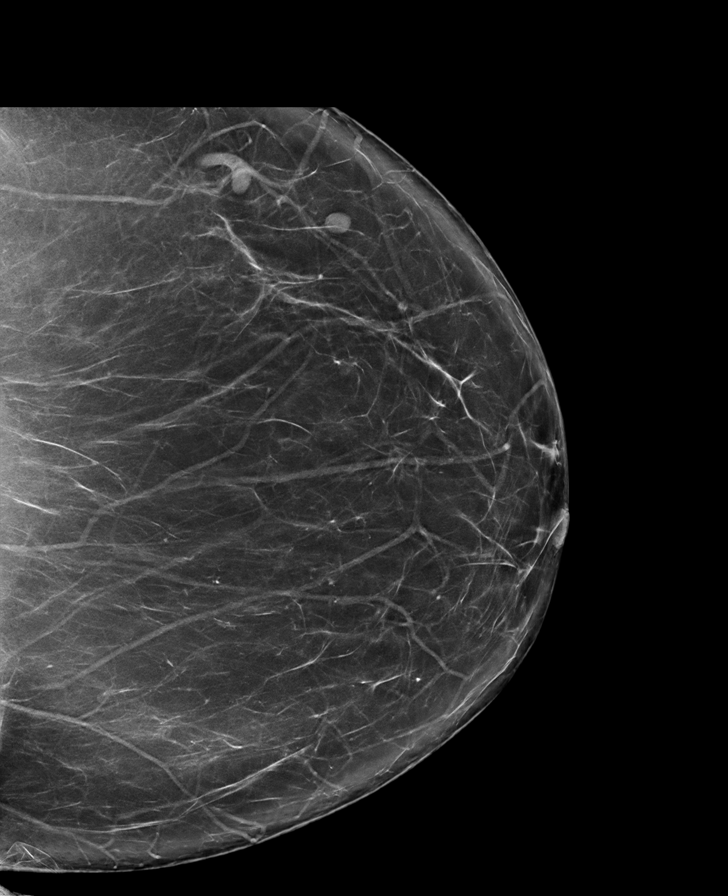

[R MLO synth-2D]
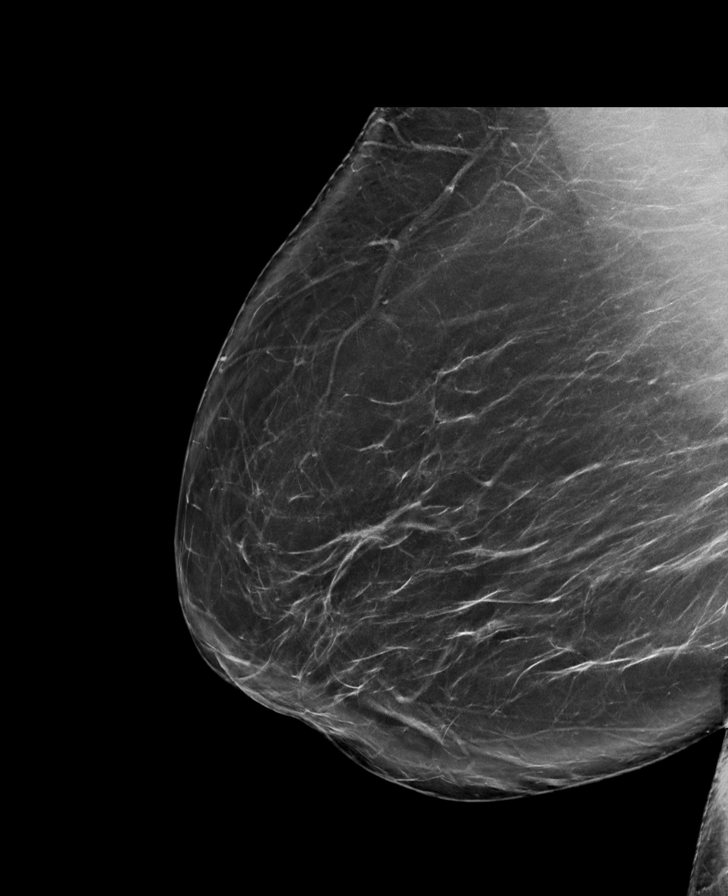

[L MLO synth-2D]
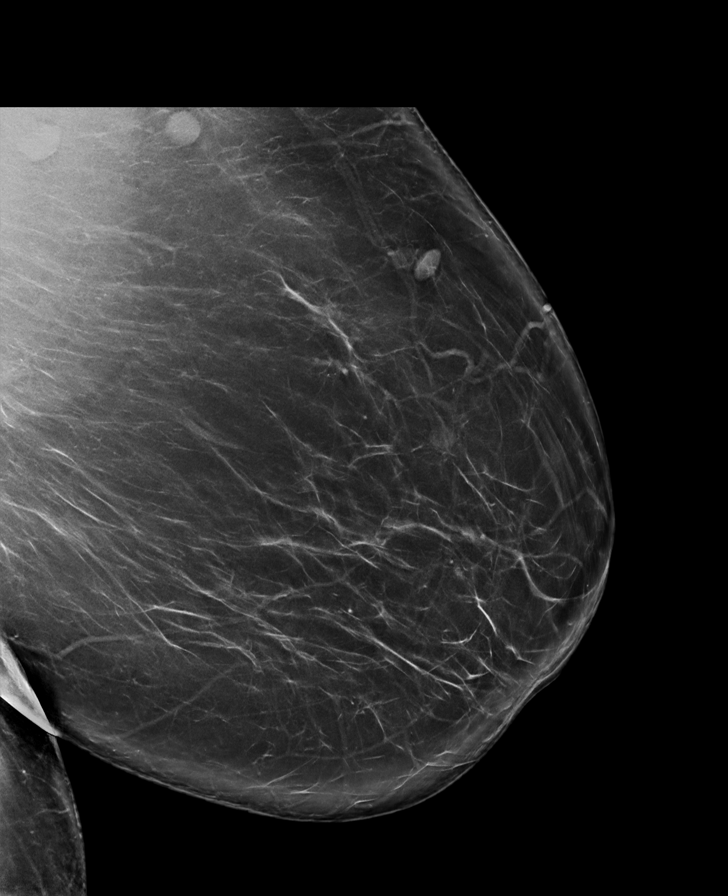

[R CC synth-2D]
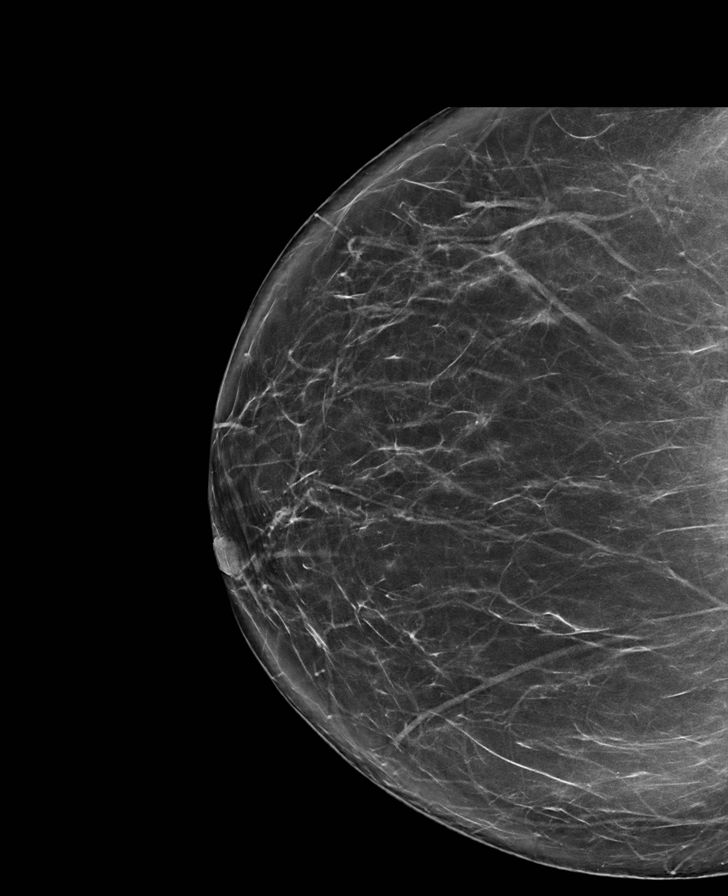

[L MLO tomo · tomo slice 52/103.0]
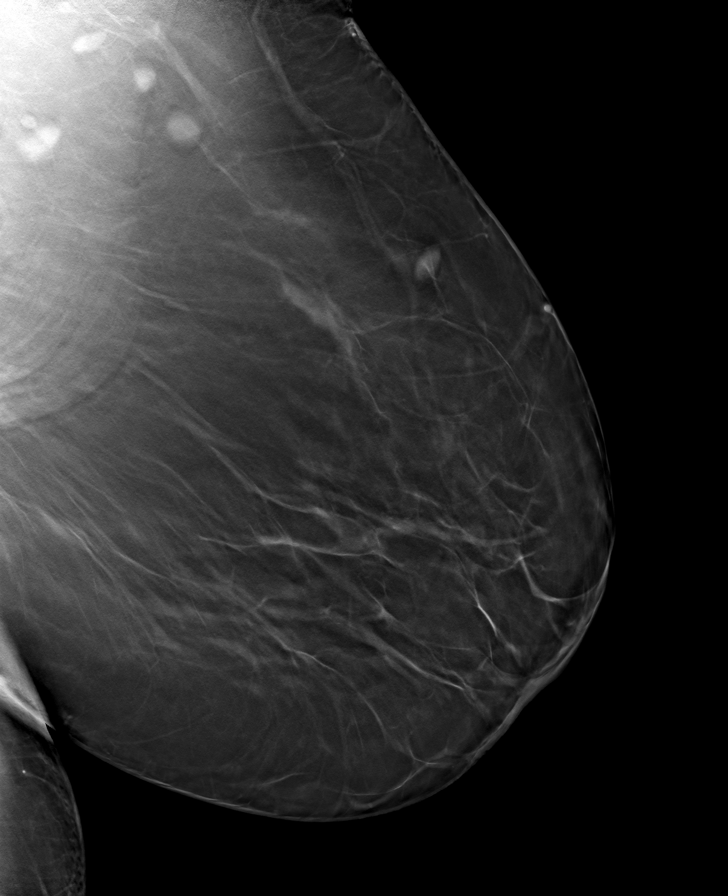

[L CC tomo · tomo slice 45/89.0]
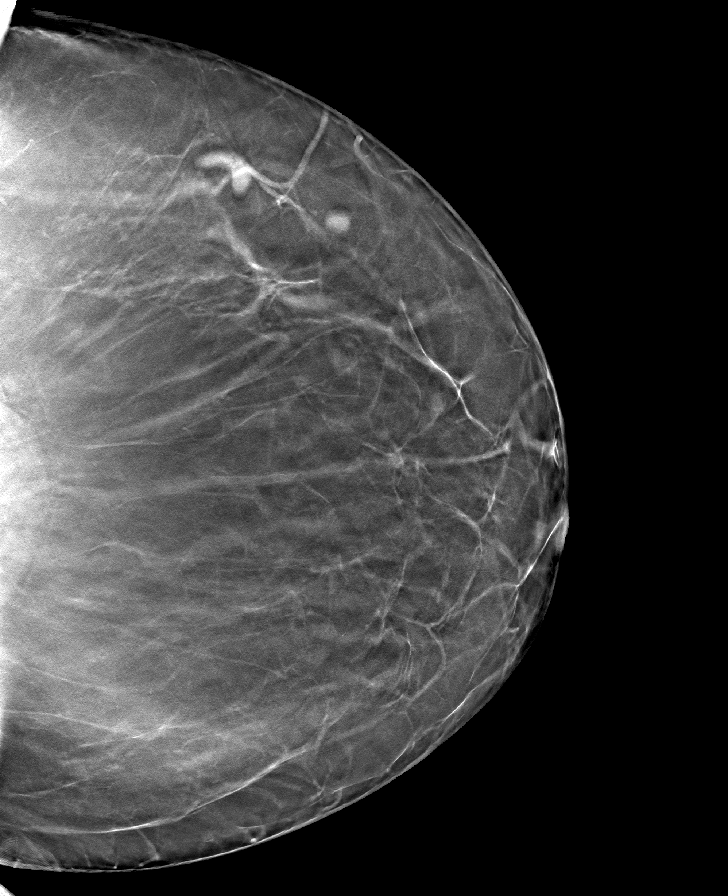

[R CC tomo · tomo slice 41/82.0]
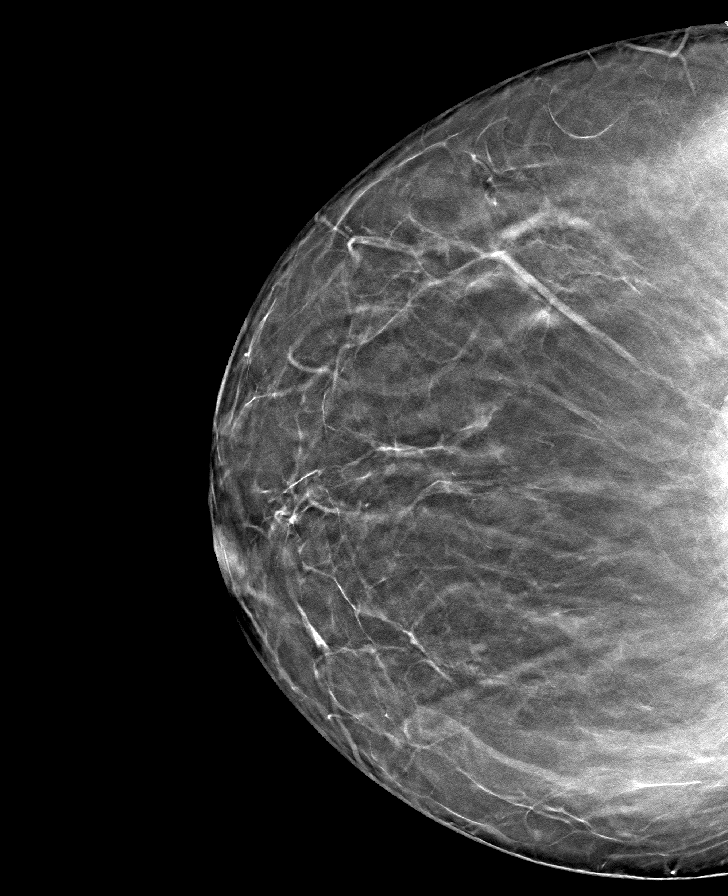

[R MLO tomo · tomo slice 50/99.0]
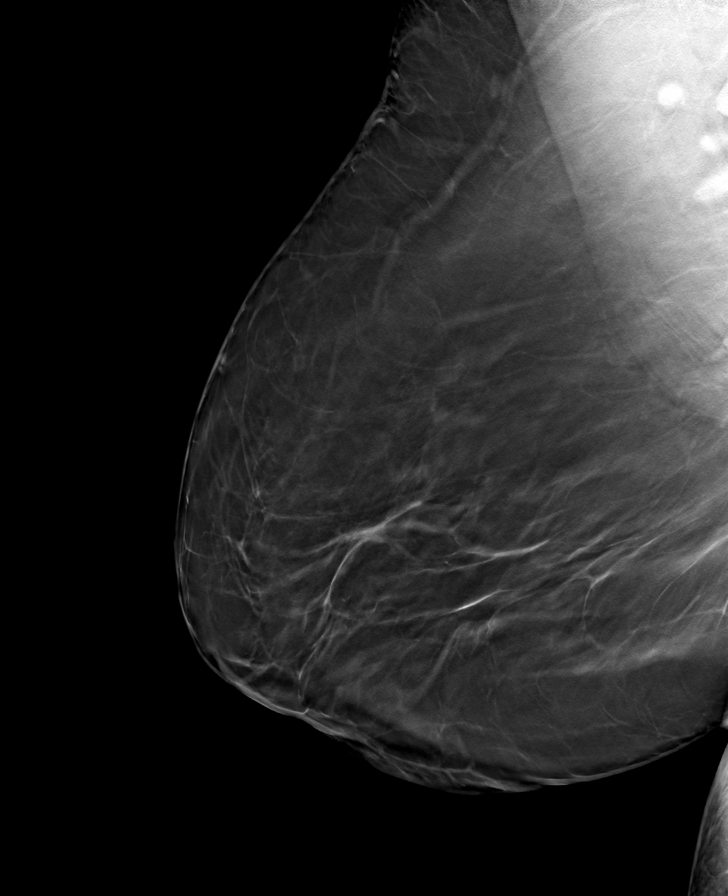

[8 of 24 positions shown; findings below may reference images not displayed]

FINDINGS: No suspicious calcifications, masses or areas of distortion are seen
in the bilateral breasts.

Mammographic images were processed with CAD.
IMPRESSION: 1. There are no suspicious mammographic findings in either breast to
correspond with the patient's chronic intermittent bilateral breast
pain.

2.  No mammographic evidence of malignancy in the bilateral breasts.

RECOMMENDATION:
Screening mammogram in one year.(Code:[id number])

I have discussed the findings and recommendations with the patient.
If applicable, a reminder letter will be sent to the patient
regarding the next appointment.

BI-RADS CATEGORY  1: Negative.

## 2022-08-31 ENCOUNTER — Ambulatory Visit (INDEPENDENT_AMBULATORY_CARE_PROVIDER_SITE_OTHER): Payer: Self-pay | Admitting: Primary Care

## 2022-10-26 ENCOUNTER — Ambulatory Visit (INDEPENDENT_AMBULATORY_CARE_PROVIDER_SITE_OTHER): Payer: Self-pay | Admitting: Primary Care

## 2022-10-27 ENCOUNTER — Ambulatory Visit (INDEPENDENT_AMBULATORY_CARE_PROVIDER_SITE_OTHER): Payer: Self-pay | Admitting: Primary Care

## 2022-10-27 ENCOUNTER — Encounter (INDEPENDENT_AMBULATORY_CARE_PROVIDER_SITE_OTHER): Payer: Self-pay | Admitting: Primary Care

## 2022-10-27 VITALS — BP 144/92 | HR 82 | Resp 16 | Wt 264.4 lb

## 2022-10-27 DIAGNOSIS — I10 Essential (primary) hypertension: Secondary | ICD-10-CM

## 2022-10-27 DIAGNOSIS — R350 Frequency of micturition: Secondary | ICD-10-CM

## 2022-10-27 DIAGNOSIS — Z1211 Encounter for screening for malignant neoplasm of colon: Secondary | ICD-10-CM

## 2022-10-27 DIAGNOSIS — R7303 Prediabetes: Secondary | ICD-10-CM

## 2022-10-27 DIAGNOSIS — Z862 Personal history of diseases of the blood and blood-forming organs and certain disorders involving the immune mechanism: Secondary | ICD-10-CM

## 2022-10-27 DIAGNOSIS — R631 Polydipsia: Secondary | ICD-10-CM

## 2022-10-27 DIAGNOSIS — Z6841 Body Mass Index (BMI) 40.0 and over, adult: Secondary | ICD-10-CM

## 2022-10-27 DIAGNOSIS — E785 Hyperlipidemia, unspecified: Secondary | ICD-10-CM

## 2022-10-27 NOTE — Progress Notes (Signed)
[place], is a 56 y.o. female  [id number]  [id number]  DOB - 12-19-1966  Chief Complaint  Patient presents with   Hypertension       Subjective:   [name] is a 56 y.o. morbid female here today for a follow up visit for hypertension. Patient has No headache, No chest pain, No abdominal pain - No Nausea, No new weakness tingling or numbness, No Cough - shortness of breath. Patient has noticed that she has increase in thirst, urination and vision changes .  No problems updated.  Allergies  Allergen Reactions   Egg Shells Swelling    Face and lips swelled.   Shellfish Allergy Swelling    Face and lips swelled   Amoxicillin Rash    Has patient had a PCN reaction causing immediate rash, facial/tongue/throat swelling, SOB or lightheadedness with hypotension: No Has patient had a PCN reaction causing severe rash involving mucus membranes or skin necrosis: No Has patient had a PCN reaction that required hospitalization: No Has patient had a PCN reaction occurring within the last 10 years: No If all of the above answers are "NO", then may proceed with Cephalosporin use.     Past Medical History:  Diagnosis Date   Anemia    Fibroids    Hyperlipidemia    Hypertension     Current Outpatient Medications on File Prior to Visit  Medication Sig Dispense Refill   EPINEPHrine 0.3 mg/0.3 mL IJ SOAJ injection Inject 0.3 mg into the muscle as needed for anaphylaxis. 2 each 1   hydrochlorothiazide (HYDRODIURIL) 25 MG tablet Take 1 tablet (25 mg total) by mouth daily. 90 tablet 1   loratadine (CLARITIN) 10 MG tablet Take 1 tablet (10 mg total) by mouth daily. 90 tablet 1   pravastatin (PRAVACHOL) 10 MG tablet Take 1 tablet (10 mg total) by mouth daily. APPOINTMENT NEEDED FOR ADDITIONAL REFILLS (Patient not taking: Reported on 02/11/2022) 30 tablet 1   No current facility-administered medications on file prior to visit.  Comprehensive ROS  Pertinent positive and negative noted in HPI    Objective:   Vitals:   10/27/22 1518  BP: (!) 144/92  Pulse: 82  Resp: 16  SpO2: 98%  Weight: 264 lb 6.4 oz (119.9 kg)    Exam General appearance : morbid obesity , Awake, alert, not in any distress. Speech Clear. Not toxic looking HEENT: Atraumatic and Normocephalic, pupils equally reactive to light and accomodation Neck: Supple, no JVD. No cervical lymphadenopathy.  Chest: Good air entry bilaterally, no added sounds  CVS: S1 S2 regular, no murmurs.  Abdomen: Bowel sounds present, Non tender and not distended with no gaurding, rigidity or rebound. Extremities: B/L Lower Ext shows no edema, both legs are warm to touch Neurology: Awake alert, and oriented X 3, Non focal Skin: No Rash  Data Review Lab Results  Component Value Date   HGBA1C 6.3 10/27/2022   HGBA1C 5.6 12/14/2018    Assessment & Plan  Christina Stanley was seen today for hypertension.  Diagnoses and all orders for this visit:  History of anemia CBC  Hypertension, unspecified type BP goal - < 130/80 Explained that having normal blood pressure is the goal and medications are helping to get to goal and maintain normal blood pressure. DIET: Limit salt intake, read nutrition labels to check salt content, limit fried and high fatty foods  Avoid using multisymptom OTC cold preparations that generally contain sudafed which can rise BP. Consult with pharmacist on best cold  relief products to use for persons with HTN EXERCISE Discussed incorporating exercise such as walking - 30 minutes most days of the week and can do in 10 minute intervals     Morbid obesity (HCC) Morbid Obesity is > 40 indicating an excess in caloric intake or underlining conditions. This may lead to other co-morbidities. Educated on lifestyle modifications of diet and exercise which may reduce obesity.    Hyperlipidemia, unspecified hyperlipidemia type Atherosclerotic lesions can be fibrotic, calcified, or  lipid-laden.  Thin capped atheromas are more likely to rupture causing  arthero-thrombotic occlusions.  Risk factors include advanced aging, hypertension, tobacco use, diabetes mellitus, dyslipidemia, family history of premature onset of cardia artery disease and sedentary lifestyle.   Urination frequency 2/2 Polydipsia  Hemoglobin A1c 6.3  Colon cancer screening -     Fecal occult blood, imunochemical; Future  Prediabetes -     POCT glycosylated hemoglobin (Hb A1C) 6.3 previously 3 years ago 5.6 new diagnosis start with lifestyle modifications - educated on lifestyle modifications, including but not limited to diet choices and adding exercise to daily routine.       Patient have been counseled extensively about nutrition and exercise. Other issues discussed during this visit include: low cholesterol diet, weight control and daily exercise, foot care, annual eye examinations at Ophthal[facility], importance of adherence with medications and regular follow-up. We also discussed long term complications of uncontrolled diabetes and hypertension.   Return in about 3 months (around 01/27/2023) for Ck A12C/ Bp.  The patient was given clear instructions to go to ER or return to medical center if symptoms don't improve, worsen or new problems develop. The patient verbalized understanding. The patient was told to call to get lab results if they haven't heard anything in the next week.   This note has been created with Survey[employer], quantity. Any transcriptional errors are unintentional.   Kerin Perna, NP 10/31/2022, 7:36 PM

## 2022-10-27 NOTE — Patient Instructions (Addendum)
Calorie Counting for Weight Loss Calories are units of energy. Your body needs a certain number of calories from food to keep going throughout the day. When you eat or drink more calories than your body needs, your body stores the extra calories mostly as fat. When you eat or drink fewer calories than your body needs, your body burns fat to get the energy it needs. Calorie counting means keeping track of how many calories you eat and drink each day. Calorie counting can be helpful if you need to lose weight. If you eat fewer calories than your body needs, you should lose weight. Ask your health care provider what a healthy weight is for you. For calorie counting to work, you will need to eat the right number of calories each day to lose a healthy amount of weight per week. A dietitian can help you figure out how many calories you need in a day and will suggest ways to reach your calorie goal. A healthy amount of weight to lose each week is usually 1-2 lb (0.5-0.9 kg). This usually means that your daily calorie intake should be reduced by 500-750 calories. Eating 1,200-1,500 calories a day can help most women lose weight. Eating 1,500-1,800 calories a day can help most men lose weight. What do I need to know about calorie counting? Work with your health care provider or dietitian to determine how many calories you should get each day. To meet your daily calorie goal, you will need to: Find out how many calories are in each food that you would like to eat. Try to do this before you eat. Decide how much of the food you plan to eat. Keep a food log. Do this by writing down what you ate and how many calories it had. To successfully lose weight, it is important to balance calorie counting with a healthy lifestyle that includes regular activity. Where do I find calorie information?  The number of calories in a food can be found on a Nutrition Facts label. If a food does not have a Nutrition Facts label, try  to look up the calories online or ask your dietitian for help. Remember that calories are listed per serving. If you choose to have more than one serving of a food, you will have to multiply the calories per serving by the number of servings you plan to eat. For example, the label on a package of bread might say that a serving size is 1 slice and that there are 90 calories in a serving. If you eat 1 slice, you will have eaten 90 calories. If you eat 2 slices, you will have eaten 180 calories. How do I keep a food log? After each time that you eat, record the following in your food log as soon as possible: What you ate. Be sure to include toppings, sauces, and other extras on the food. How much you ate. This can be measured in cups, ounces, or number of items. How many calories were in each food and drink. The total number of calories in the food you ate. Keep your food log near you, such as in a pocket-sized notebook or on an app or website on your mobile phone. Some programs will calculate calories for you and show you how many calories you have left to meet your daily goal. What are some portion-control tips? Know how many calories are in a serving. This will help you know how many servings you can have of a certain   food. Use a measuring cup to measure serving sizes. You could also try weighing out portions on a kitchen scale. With time, you will be able to estimate serving sizes for some foods. Take time to put servings of different foods on your favorite plates or in your favorite bowls and cups so you know what a serving looks like. Try not to eat straight from a food's packaging, such as from a bag or box. Eating straight from the package makes it hard to see how much you are eating and can lead to overeating. Put the amount you would like to eat in a cup or on a plate to make sure you are eating the right portion. Use smaller plates, glasses, and bowls for smaller portions and to prevent  overeating. Try not to multitask. For example, avoid watching TV or using your computer while eating. If it is time to eat, sit down at a table and enjoy your food. This will help you recognize when you are full. It will also help you be more mindful of what and how much you are eating. What are tips for following this plan? Reading food labels Check the calorie count compared with the serving size. The serving size may be smaller than what you are used to eating. Check the source of the calories. Try to choose foods that are high in protein, fiber, and vitamins, and low in saturated fat, trans fat, and sodium. Shopping Read nutrition labels while you shop. This will help you make healthy decisions about which foods to buy. Pay attention to nutrition labels for low-fat or fat-free foods. These foods sometimes have the same number of calories or more calories than the full-fat versions. They also often have added sugar, starch, or salt to make up for flavor that was removed with the fat. Make a grocery list of lower-calorie foods and stick to it. Cooking Try to cook your favorite foods in a healthier way. For example, try baking instead of frying. Use low-fat dairy products. Meal planning Use more fruits and vegetables. One-half of your plate should be fruits and vegetables. Include lean proteins, such as chicken, turkey, and fish. Lifestyle Each week, aim to do one of the following: 150 minutes of moderate exercise, such as walking. 75 minutes of vigorous exercise, such as running. General information Know how many calories are in the foods you eat most often. This will help you calculate calorie counts faster. Find a way of tracking calories that works for you. Get creative. Try different apps or programs if writing down calories does not work for you. What foods should I eat?  Eat nutritious foods. It is better to have a nutritious, high-calorie food, such as an avocado, than a food with  few nutrients, such as a bag of potato chips. Use your calories on foods and drinks that will fill you up and will not leave you hungry soon after eating. Examples of foods that fill you up are nuts and nut butters, vegetables, lean proteins, and high-fiber foods such as whole grains. High-fiber foods are foods with more than 5 g of fiber per serving. Pay attention to calories in drinks. Low-calorie drinks include water and unsweetened drinks. The items listed above may not be a complete list of foods and beverages you can eat. Contact a dietitian for more information. What foods should I limit? Limit foods or drinks that are not good sources of vitamins, minerals, or protein or that are high in unhealthy fats. These   include: Candy. Other sweets. Sodas, specialty coffee drinks, alcohol, and juice. The items listed above may not be a complete list of foods and beverages you should avoid. Contact a dietitian for more information. How do I count calories when eating out? Pay attention to portions. Often, portions are much larger when eating out. Try these tips to keep portions smaller: Consider sharing a meal instead of getting your own. If you get your own meal, eat only half of it. Before you start eating, ask for a container and put half of your meal into it. When available, consider ordering smaller portions from the menu instead of full portions. Pay attention to your food and drink choices. Knowing the way food is cooked and what is included with the meal can help you eat fewer calories. If calories are listed on the menu, choose the lower-calorie options. Choose dishes that include vegetables, fruits, whole grains, low-fat dairy products, and lean proteins. Choose items that are boiled, broiled, grilled, or steamed. Avoid items that are buttered, battered, fried, or served with cream sauce. Items labeled as crispy are usually fried, unless stated otherwise. Choose water, low-fat milk,  unsweetened iced tea, or other drinks without added sugar. If you want an alcoholic beverage, choose a lower-calorie option, such as a glass of wine or light beer. Ask for dressings, sauces, and syrups on the side. These are usually high in calories, so you should limit the amount you eat. If you want a salad, choose a garden salad and ask for grilled meats. Avoid extra toppings such as bacon, cheese, or fried items. Ask for the dressing on the side, or ask for olive oil and vinegar or lemon to use as dressing. Estimate how many servings of a food you are given. Knowing serving sizes will help you be aware of how much food you are eating at restaurants. Where to find more information Centers for Disease Control and Prevention: http://www.wolf.info/ U.S. Department of Agriculture: http://www.wilson-mendoza.org/ Summary Calorie counting means keeping track of how many calories you eat and drink each day. If you eat fewer calories than your body needs, you should lose weight. A healthy amount of weight to lose per week is usually 1-2 lb (0.5-0.9 kg). This usually means reducing your daily calorie intake by 500-750 calories. The number of calories in a food can be found on a Nutrition Facts label. If a food does not have a Nutrition Facts label, try to look up the calories online or ask your dietitian for help. Use smaller plates, glasses, and bowls for smaller portions and to prevent overeating. Use your calories on foods and drinks that will fill you up and not leave you hungry shortly after a meal. This information is not intended to replace advice given to you by your health care provider. Make sure you discuss any questions you have with your health care provider. Document Revised: 01/24/2020 Document Reviewed: 01/24/2020 Elsevier Patient Education  [place]. Preventing Type 2 Diabetes Mellitus Type 2 diabetes, also called type 2 diabetes mellitus, is a long-term (chronic) disease that affects sugar (glucose) levels  in your blood. Normally, a hormone called insulin allows glucose to enter cells in your body. The cells use glucose for energy. With type 2 diabetes, you will have one or both of these problems: Your pancreas does not make enough insulin. Cells in your body do not respond properly to insulin that your body makes (insulin resistance). Insulin resistance or lack of insulin causes extra glucose to build  up in the blood instead of going into cells. As a result, high blood glucose (hyperglycemia) develops. That can cause many complications. Being overweight or obese and having an inactive (sedentary) lifestyle can increase your risk for diabetes. Type 2 diabetes can be delayed or prevented by making certain nutrition and lifestyle changes. How can this condition affect me? If you do not take steps to prevent diabetes, your blood glucose levels may keep increasing over time. Too much glucose in your blood for a long time can damage your blood vessels, heart, kidneys, nerves, and eyes. Type 2 diabetes can lead to chronic health problems and complications, such as: Heart disease. Stroke. Blindness. Kidney disease. Depression. Poor circulation in your feet and legs. In severe cases, a foot or leg may need to be surgically removed (amputated). What can increase my risk? You may be more likely to develop type 2 diabetes if you: Have type 2 diabetes in your family. Are overweight or obese. Have a sedentary lifestyle. Have insulin resistance or a history of prediabetes. Have a history of pregnancy-related (gestational) diabetes or polycystic ovary syndrome (PCOS). What actions can I take to prevent this? It can be difficult to recognize signs of type 2 diabetes. Taking action to prevent the disease before you develop symptoms is the best way to avoid possible damage to your body. Making certain nutrition and lifestyle changes may prevent or delay the disease and related health problems. Nutrition  Eat  healthy meals and snacks regularly. Do not skip meals. Fruit or a handful of nuts is a healthy snack between meals. Drink water throughout the day. Avoid drinks that contain added sugar, such as soda or sweetened tea. Drink enough fluid to keep your urine pale yellow. Follow instructions from your health care provider about eating or drinking restrictions. Limit the amount of food you eat by: Managing how much you eat at a time (portion size). Checking food labels for the serving sizes of food. Using a kitchen scale to weigh amounts of food. Saut or steam food instead of frying it. Cook with water or broth instead of oils or butter. Limit saturated fat and salt (sodium) in your diet. Have no more than 1 tsp (2,400 mg) of sodium a day. If you have heart disease or high blood pressure, use less than ? tsp (1,500 mg) of sodium a day. Lifestyle  Lose weight if needed and as told. Your health care provider can determine how much weight loss is best for you and can help you lose weight safely. If you are overweight or obese, you may be told to lose at least 5?7% of your body weight. Manage blood pressure, cholesterol, and stress. Your health care provider will help determine the best treatment for you. Do not use any products that contain nicotine or tobacco. These products include cigarettes, chewing tobacco, and vaping devices, such as e-cigarettes. If you need help quitting, ask your health care provider. Activity  Do physical activity that makes your heart beat faster and makes you sweat (moderate intensity). Do this for at least 30 minutes on at least 5 days of the week, or as much as told by your health care provider. Ask your health care provider what activities are safe for you. A mix of activities may be best, such as walking, swimming, cycling, and strength training. Try to add physical activity into your day. For example: Park your car farther away than usual so that you walk more. Take  a walk during your lunch  break. Use stairs instead of elevators or escalators. Walk or bike to work instead of driving. Alcohol use If you drink alcohol: Limit how much you have to: 0?1 drink a day for women who are not pregnant. 0?2 drinks a day for men. Know how much alcohol is in your drink. In the [place] one drink equals one 12 oz bottle of beer (355 mL), one 5 oz glass of wine (148 mL), or one 1 oz glass of hard liquor (44 mL). General information Talk with your health care provider about your risk factors and how you can reduce your risk for diabetes. Have your blood glucose tested regularly, as told by your health care provider. Get screening tests as told by your health care provider. You may have these regularly, especially if you have certain risk factors for type 2 diabetes. Make an appointment with a registered dietitian. This diet and nutrition specialist can help you make a healthy eating plan and help you understand portion sizes and food labels. Where to find support Ask your health care provider to recommend a registered dietitian, a certified diabetes care and education specialist, or a weight loss program. Look for local or online weight loss groups. Join a gym, fitness club, or outdoor activity group, such as a walking club. Where to find more information For help and guidance and to learn more about diabetes and diabetes prevention, visit: American Diabetes Association (ADA): www.diabetes.[employer] of Diabetes and Digestive and Kidney Diseases: [employer].dk To learn more about healthy eating, visit: U.S. Department of Agriculture Scientist, research (physical sciences)): FormerBoss.no Office of Disease Prevention and Health Promotion (ODPHP): [place].be Summary You can delay or prevent type 2 diabetes by eating healthy foods, losing weight if needed, and increasing your physical activity. Talk with your health care provider about your risk factors for type 2 diabetes and how  you can reduce your risk. It can be difficult to recognize the signs of type 2 diabetes. The best way to avoid possible damage to your body is to take action to prevent the disease before you develop symptoms. Get screening tests as told by your health care provider. This information is not intended to replace advice given to you by your health care provider. Make sure you discuss any questions you have with your health care provider. Document Revised: 03/09/2021 Document Reviewed: 03/09/2021 Elsevier Patient Education  [place].

## 2022-10-28 LAB — POCT GLYCOSYLATED HEMOGLOBIN (HGB A1C): HbA1c, POC (prediabetic range): 6.3 % (ref 5.7–6.4)

## 2022-10-29 LAB — CBC WITH DIFFERENTIAL/PLATELET
Basophils Absolute: 0 10*3/uL (ref 0.0–0.2)
Basos: 1 %
EOS (ABSOLUTE): 0.3 10*3/uL (ref 0.0–0.4)
Eos: 4 %
Hematocrit: 37.2 % (ref 34.0–46.6)
Hemoglobin: 11.8 g/dL (ref 11.1–15.9)
Immature Grans (Abs): 0 10*3/uL (ref 0.0–0.1)
Immature Granulocytes: 0 %
Lymphocytes Absolute: 2.1 10*3/uL (ref 0.7–3.1)
Lymphs: 34 %
MCH: 23.3 pg — ABNORMAL LOW (ref 26.6–33.0)
MCHC: 31.7 g/dL (ref 31.5–35.7)
MCV: 74 fL — ABNORMAL LOW (ref 79–97)
Monocytes Absolute: 0.6 10*3/uL (ref 0.1–0.9)
Monocytes: 10 %
Neutrophils Absolute: 3.1 10*3/uL (ref 1.4–7.0)
Neutrophils: 51 %
Platelets: 279 10*3/uL (ref 150–450)
RBC: 5.06 x10E6/uL (ref 3.77–5.28)
RDW: 15.2 % (ref 11.7–15.4)
WBC: 6.1 10*3/uL (ref 3.4–10.8)

## 2022-10-29 LAB — COMPREHENSIVE METABOLIC PANEL
ALT: 13 IU/L (ref 0–32)
AST: 19 IU/L (ref 0–40)
Albumin/Globulin Ratio: 1.4 (ref 1.2–2.2)
Albumin: 4.4 g/dL (ref 3.8–4.9)
Alkaline Phosphatase: 92 IU/L (ref 44–121)
BUN/Creatinine Ratio: 13 (ref 9–23)
BUN: 11 mg/dL (ref 6–24)
Bilirubin Total: 0.4 mg/dL (ref 0.0–1.2)
CO2: 27 mmol/L (ref 20–29)
Calcium: 9.5 mg/dL (ref 8.7–10.2)
Chloride: 102 mmol/L (ref 96–106)
Creatinine, Ser: 0.86 mg/dL (ref 0.57–1.00)
Globulin, Total: 3.2 g/dL (ref 1.5–4.5)
Glucose: 87 mg/dL (ref 70–99)
Potassium: 4.1 mmol/L (ref 3.5–5.2)
Sodium: 141 mmol/L (ref 134–144)
Total Protein: 7.6 g/dL (ref 6.0–8.5)
eGFR: 80 mL/min/{1.73_m2} (ref >59)

## 2022-10-29 LAB — LIPID PANEL
Chol/HDL Ratio: 2.3 ratio (ref 0.0–4.4)
Cholesterol, Total: 177 mg/dL (ref 100–199)
HDL: 78 mg/dL (ref >39)
LDL Chol Calc (NIH): 89 mg/dL (ref 0–99)
Triglycerides: 48 mg/dL (ref 0–149)
VLDL Cholesterol Cal: 10 mg/dL (ref 5–40)

## 2022-11-30 ENCOUNTER — Other Ambulatory Visit (INDEPENDENT_AMBULATORY_CARE_PROVIDER_SITE_OTHER): Payer: Self-pay | Admitting: Primary Care

## 2022-11-30 DIAGNOSIS — I10 Essential (primary) hypertension: Secondary | ICD-10-CM

## 2022-11-30 MED ORDER — PRAVASTATIN SODIUM 10 MG PO TABS: 10.0000 mg | ORAL_TABLET | Freq: Every day | ORAL | 3 refills | Status: AC

## 2022-11-30 MED ORDER — HYDROCHLOROTHIAZIDE 25 MG PO TABS: 25.0000 mg | ORAL_TABLET | Freq: Every day | ORAL | 1 refills | Status: AC

## 2022-11-30 NOTE — Telephone Encounter (Signed)
Requested Prescriptions  Pending Prescriptions Disp Refills   pravastatin (PRAVACHOL) 10 MG tablet 90 tablet 1    Sig: Take 1 tablet (10 mg total) by mouth daily. APPOINTMENT NEEDED FOR ADDITIONAL REFILLS     Cardiovascular:  Antilipid - Statins Failed - 11/30/2022  4:07 PM      Failed - Lipid Panel in normal range within the last 12 months    Cholesterol, Total  Date Value Ref Range Status  10/28/2022 177 100 - 199 mg/dL Final   LDL Chol Calc (NIH)  Date Value Ref Range Status  10/28/2022 89 0 - 99 mg/dL Final   HDL  Date Value Ref Range Status  10/28/2022 78 >39 mg/dL Final   Triglycerides  Date Value Ref Range Status  10/28/2022 48 0 - 149 mg/dL Final         Passed - Patient is not pregnant      Passed - Valid encounter within last 12 months    Recent Outpatient Visits           1 month ago History of anemia   [place], Michelle P, NP   6 months ago Hypertension, unspecified type   [place], Michelle P, NP   9 months ago Hypertension, unspecified type   [facility] Kerin Perna, NP   1 year ago Iron deficiency anemia due to chronic blood loss   [facility] Kerin Perna, NP   1 year ago Need for Tdap vaccination   [place] Kerin Perna, NP       Future Appointments             In 1 month Edwards, Milford Cage, NP [facility]             hydrochlorothiazide (HYDRODIURIL) 25 MG tablet 90 tablet 3    Sig: Take 1 tablet (25 mg total) by mouth daily.     Cardiovascular: Diuretics - Thiazide Failed - 11/30/2022  4:07 PM      Failed - Last BP in normal range    BP Readings from Last 1 Encounters:  10/27/22 (!) 144/92         Passed - Cr in normal range and within 180 days    Creatinine, Ser  Date Value Ref Range Status  10/28/2022 0.86 0.57 - 1.00 mg/dL Final         Passed  - K in normal range and within 180 days    Potassium  Date Value Ref Range Status  10/28/2022 4.1 3.5 - 5.2 mmol/L Final         Passed - Na in normal range and within 180 days    Sodium  Date Value Ref Range Status  10/28/2022 141 134 - 144 mmol/L Final         Passed - Valid encounter within last 6 months    Recent Outpatient Visits           1 month ago History of anemia   [place], Michelle P, NP   6 months ago Hypertension, unspecified type   [place] Kerin Perna, NP   9 months ago Hypertension, unspecified type   [place] Kerin Perna, NP   1 year ago Iron deficiency anemia due to chronic blood loss   [facility] Kerin Perna, NP   1 year ago Need for Tdap vaccination   [place], NP       Future Appointments             In 1 month Oletta Lamas, Milford Cage, NP [place]

## 2022-11-30 NOTE — Telephone Encounter (Signed)
Copied from [place] [id number]. Topic: General - Other >> Nov 30, 2022  3:13 PM Everette C wrote: Reason for CRM: Medication Refill - Medication: hydrochlorothiazide (HYDRODIURIL) 25 MG tablet [[id number]]  pravastatin (PRAVACHOL) 10 MG tablet [[id number]]  Has the patient contacted their pharmacy? Yes.   (Agent: If no, request that the patient contact the pharmacy for the refill. If patient does not wish to contact the pharmacy document the reason why and proceed with request.) (Agent: If yes, when and what did the pharmacy advise?)  Preferred Pharmacy (with phone number or street name): [place] - [address] [place] ([place]) [place] [address] Phone: [phone] Fax: [phone] Hours: Not open 24 hours   Has the patient been seen for an appointment in the last year OR does the patient have an upcoming appointment? Yes.    Agent: Please be advised that RX refills may take up to 3 business days. We ask that you follow-up with your pharmacy.

## 2023-01-27 ENCOUNTER — Encounter (INDEPENDENT_AMBULATORY_CARE_PROVIDER_SITE_OTHER): Payer: Self-pay | Admitting: Primary Care

## 2023-01-27 ENCOUNTER — Ambulatory Visit (INDEPENDENT_AMBULATORY_CARE_PROVIDER_SITE_OTHER): Payer: Medicaid Other | Admitting: Primary Care

## 2023-01-27 DIAGNOSIS — M25562 Pain in left knee: Secondary | ICD-10-CM

## 2023-01-27 DIAGNOSIS — I10 Essential (primary) hypertension: Secondary | ICD-10-CM

## 2023-01-27 DIAGNOSIS — M25561 Pain in right knee: Secondary | ICD-10-CM

## 2023-01-27 DIAGNOSIS — G8929 Other chronic pain: Secondary | ICD-10-CM

## 2023-01-27 DIAGNOSIS — Z1211 Encounter for screening for malignant neoplasm of colon: Secondary | ICD-10-CM

## 2023-01-27 DIAGNOSIS — Z6841 Body Mass Index (BMI) 40.0 and over, adult: Secondary | ICD-10-CM

## 2023-01-27 MED ORDER — AMLODIPINE BESYLATE 10 MG PO TABS: 10.0000 mg | ORAL_TABLET | Freq: Every day | ORAL | 1 refills | Status: DC

## 2023-01-27 NOTE — Patient Instructions (Signed)
Calorie Counting for Weight Loss Calories are units of energy. Your body needs a certain number of calories from food to keep going throughout the day. When you eat or drink more calories than your body needs, your body stores the extra calories mostly as fat. When you eat or drink fewer calories than your body needs, your body burns fat to get the energy it needs. Calorie counting means keeping track of how many calories you eat and drink each day. Calorie counting can be helpful if you need to lose weight. If you eat fewer calories than your body needs, you should lose weight. Ask your health care provider what a healthy weight is for you. For calorie counting to work, you will need to eat the right number of calories each day to lose a healthy amount of weight per week. A dietitian can help you figure out how many calories you need in a day and will suggest ways to reach your calorie goal. A healthy amount of weight to lose each week is usually 1-2 lb (0.5-0.9 kg). This usually means that your daily calorie intake should be reduced by 500-750 calories. Eating 1,200-1,500 calories a day can help most women lose weight. Eating 1,500-1,800 calories a day can help most men lose weight. What do I need to know about calorie counting? Work with your health care provider or dietitian to determine how many calories you should get each day. To meet your daily calorie goal, you will need to: Find out how many calories are in each food that you would like to eat. Try to do this before you eat. Decide how much of the food you plan to eat. Keep a food log. Do this by writing down what you ate and how many calories it had. To successfully lose weight, it is important to balance calorie counting with a healthy lifestyle that includes regular activity. Where do I find calorie information?  The number of calories in a food can be found on a Nutrition Facts label. If a food does not have a Nutrition Facts label, try  to look up the calories online or ask your dietitian for help. Remember that calories are listed per serving. If you choose to have more than one serving of a food, you will have to multiply the calories per serving by the number of servings you plan to eat. For example, the label on a package of bread might say that a serving size is 1 slice and that there are 90 calories in a serving. If you eat 1 slice, you will have eaten 90 calories. If you eat 2 slices, you will have eaten 180 calories. How do I keep a food log? After each time that you eat, record the following in your food log as soon as possible: What you ate. Be sure to include toppings, sauces, and other extras on the food. How much you ate. This can be measured in cups, ounces, or number of items. How many calories were in each food and drink. The total number of calories in the food you ate. Keep your food log near you, such as in a pocket-sized notebook or on an app or website on your mobile phone. Some programs will calculate calories for you and show you how many calories you have left to meet your daily goal. What are some portion-control tips? Know how many calories are in a serving. This will help you know how many servings you can have of a certain   food. Use a measuring cup to measure serving sizes. You could also try weighing out portions on a kitchen scale. With time, you will be able to estimate serving sizes for some foods. Take time to put servings of different foods on your favorite plates or in your favorite bowls and cups so you know what a serving looks like. Try not to eat straight from a food's packaging, such as from a bag or box. Eating straight from the package makes it hard to see how much you are eating and can lead to overeating. Put the amount you would like to eat in a cup or on a plate to make sure you are eating the right portion. Use smaller plates, glasses, and bowls for smaller portions and to prevent  overeating. Try not to multitask. For example, avoid watching TV or using your computer while eating. If it is time to eat, sit down at a table and enjoy your food. This will help you recognize when you are full. It will also help you be more mindful of what and how much you are eating. What are tips for following this plan? Reading food labels Check the calorie count compared with the serving size. The serving size may be smaller than what you are used to eating. Check the source of the calories. Try to choose foods that are high in protein, fiber, and vitamins, and low in saturated fat, trans fat, and sodium. Shopping Read nutrition labels while you shop. This will help you make healthy decisions about which foods to buy. Pay attention to nutrition labels for low-fat or fat-free foods. These foods sometimes have the same number of calories or more calories than the full-fat versions. They also often have added sugar, starch, or salt to make up for flavor that was removed with the fat. Make a grocery list of lower-calorie foods and stick to it. Cooking Try to cook your favorite foods in a healthier way. For example, try baking instead of frying. Use low-fat dairy products. Meal planning Use more fruits and vegetables. One-half of your plate should be fruits and vegetables. Include lean proteins, such as chicken, turkey, and fish. Lifestyle Each week, aim to do one of the following: 150 minutes of moderate exercise, such as walking. 75 minutes of vigorous exercise, such as running. General information Know how many calories are in the foods you eat most often. This will help you calculate calorie counts faster. Find a way of tracking calories that works for you. Get creative. Try different apps or programs if writing down calories does not work for you. What foods should I eat?  Eat nutritious foods. It is better to have a nutritious, high-calorie food, such as an avocado, than a food with  few nutrients, such as a bag of potato chips. Use your calories on foods and drinks that will fill you up and will not leave you hungry soon after eating. Examples of foods that fill you up are nuts and nut butters, vegetables, lean proteins, and high-fiber foods such as whole grains. High-fiber foods are foods with more than 5 g of fiber per serving. Pay attention to calories in drinks. Low-calorie drinks include water and unsweetened drinks. The items listed above may not be a complete list of foods and beverages you can eat. Contact a dietitian for more information. What foods should I limit? Limit foods or drinks that are not good sources of vitamins, minerals, or protein or that are high in unhealthy fats. These   include: Candy. Other sweets. Sodas, specialty coffee drinks, alcohol, and juice. The items listed above may not be a complete list of foods and beverages you should avoid. Contact a dietitian for more information. How do I count calories when eating out? Pay attention to portions. Often, portions are much larger when eating out. Try these tips to keep portions smaller: Consider sharing a meal instead of getting your own. If you get your own meal, eat only half of it. Before you start eating, ask for a container and put half of your meal into it. When available, consider ordering smaller portions from the menu instead of full portions. Pay attention to your food and drink choices. Knowing the way food is cooked and what is included with the meal can help you eat fewer calories. If calories are listed on the menu, choose the lower-calorie options. Choose dishes that include vegetables, fruits, whole grains, low-fat dairy products, and lean proteins. Choose items that are boiled, broiled, grilled, or steamed. Avoid items that are buttered, battered, fried, or served with cream sauce. Items labeled as crispy are usually fried, unless stated otherwise. Choose water, low-fat milk,  unsweetened iced tea, or other drinks without added sugar. If you want an alcoholic beverage, choose a lower-calorie option, such as a glass of wine or light beer. Ask for dressings, sauces, and syrups on the side. These are usually high in calories, so you should limit the amount you eat. If you want a salad, choose a garden salad and ask for grilled meats. Avoid extra toppings such as bacon, cheese, or fried items. Ask for the dressing on the side, or ask for olive oil and vinegar or lemon to use as dressing. Estimate how many servings of a food you are given. Knowing serving sizes will help you be aware of how much food you are eating at restaurants. Where to find more information Centers for Disease Control and Prevention: www.cdc.gov U.S. Department of Agriculture: myplate.gov Summary Calorie counting means keeping track of how many calories you eat and drink each day. If you eat fewer calories than your body needs, you should lose weight. A healthy amount of weight to lose per week is usually 1-2 lb (0.5-0.9 kg). This usually means reducing your daily calorie intake by 500-750 calories. The number of calories in a food can be found on a Nutrition Facts label. If a food does not have a Nutrition Facts label, try to look up the calories online or ask your dietitian for help. Use smaller plates, glasses, and bowls for smaller portions and to prevent overeating. Use your calories on foods and drinks that will fill you up and not leave you hungry shortly after a meal. This information is not intended to replace advice given to you by your health care provider. Make sure you discuss any questions you have with your health care provider. Document Revised: 01/24/2020 Document Reviewed: 01/24/2020 Elsevier Patient Education  2023 Elsevier Inc.  

## 2023-01-27 NOTE — Progress Notes (Signed)
[place]   [name] is a 57 y.o. morbid obesity female presents for hypertension evaluation, Denies shortness of breath, headaches, chest pain or lower extremity edema, sudden onset, vision changes, unilateral weakness, dizziness, paresthesias   Patient reports adherence with medications.  Dietary habits include: monitor  Exercise habits include:right knee hurts and swell  Family / Social history: Father -MI deceased Mother CVA   Past Medical History:  Diagnosis Date   Anemia    Fibroids    Hyperlipidemia    Hypertension    Past Surgical History:  Procedure Laterality Date   DILATION AND CURETTAGE OF UTERUS N/A 07/06/2018   Procedure: VAGINAL MUCOSA LACERATION REPAIR;  Surgeon: Chancy Milroy, MD;  Location: [facility];  Service: Gynecology;  Laterality: N/A;   ECTOPIC PREGNANCY SURGERY     two pregnancy   SALPINGECTOMY     TOTAL ABDOMINAL HYSTERECTOMY W/ BILATERAL SALPINGOOPHORECTOMY  08/23/2018   UTERINE FIBROID SURGERY  08/12/2010   diagnositic hysterscopy with D&C and removal of central fibroid.    Allergies  Allergen Reactions   Egg Shells Swelling    Face and lips swelled.   Shellfish Allergy Swelling    Face and lips swelled   Amoxicillin Rash    Has patient had a PCN reaction causing immediate rash, facial/tongue/throat swelling, SOB or lightheadedness with hypotension: No Has patient had a PCN reaction causing severe rash involving mucus membranes or skin necrosis: No Has patient had a PCN reaction that required hospitalization: No Has patient had a PCN reaction occurring within the last 10 years: No If all of the above answers are "NO", then may proceed with Cephalosporin use.    Current Outpatient Medications on File Prior to Visit  Medication Sig Dispense Refill   EPINEPHrine 0.3 mg/0.3 mL IJ SOAJ injection Inject 0.3 mg into the muscle as needed for anaphylaxis. 2 each 1   hydrochlorothiazide (HYDRODIURIL) 25 MG tablet Take 1  tablet (25 mg total) by mouth daily. 90 tablet 1   loratadine (CLARITIN) 10 MG tablet Take 1 tablet (10 mg total) by mouth daily. 90 tablet 1   pravastatin (PRAVACHOL) 10 MG tablet Take 1 tablet (10 mg total) by mouth daily. 90 tablet 3   No current facility-administered medications on file prior to visit.   Social History   Socioeconomic History   Marital status: Single    Spouse name: Not on file   Number of children: 1   Years of education: Not on file   Highest education level: High school graduate  Occupational History   Not on file  Tobacco Use   Smoking status: Never   Smokeless tobacco: Never  Vaping Use   Vaping Use: Never used  Substance and Sexual Activity   Alcohol use: Not Currently    Comment: ocassional   Drug use: No   Sexual activity: Yes    Birth control/protection: Surgical  Other Topics Concern   Not on file  Social History Narrative   Not on file   Social Determinants of Health   Financial Resource Strain: Not on file  Food Insecurity: No Food Insecurity (01/26/2022)   Hunger Vital Sign    Worried About Running Out of Food in the Last Year: Never true    Ran Out of Food in the Last Year: Never true  Transportation Needs: No Transportation Needs (01/26/2022)   PRAPARE - Hydrologist (Medical): No    Lack of Transportation (Non-Medical): No  Physical  Activity: Not on file  Stress: Not on file  Social Connections: Not on file  Intimate Partner Violence: Not on file   Family History  Problem Relation Age of Onset   Diabetes Mother    Hypertension Mother    Cancer Father        colon   Diabetes Father    Hypertension Father    Diabetes Sister    Hypertension Sister    Cancer Maternal Grandmother    Breast cancer Neg Hx    OBJECTIVE:  Vitals:   01/27/23 1539 01/27/23 1542  BP: (Abnormal) 162/112 (Abnormal) 153/103  Pulse: 74   Resp: 16   SpO2: 97%   Weight: 255 lb 6.4 oz (115.8 kg)   Height: '5\' 6"'$  (1.676 m)      Physical Exam Vitals reviewed.  Constitutional:      Appearance: She is obese.     Comments: morbid  HENT:     Head: Normocephalic.     Right Ear: Tympanic membrane and external ear normal.     Left Ear: Tympanic membrane and external ear normal.     Nose: Nose normal.  Eyes:     Extraocular Movements: Extraocular movements intact.  Cardiovascular:     Rate and Rhythm: Normal rate and regular rhythm.  Pulmonary:     Effort: Pulmonary effort is normal.     Breath sounds: Normal breath sounds.  Abdominal:     General: Bowel sounds are normal. There is distension.     Palpations: Abdomen is soft.  Musculoskeletal:        General: Normal range of motion.     Cervical back: Normal range of motion and neck supple.  Skin:    General: Skin is warm and dry.  Neurological:     Mental Status: She is alert and oriented to person, place, and time.  Psychiatric:        Mood and Affect: Mood normal.        Behavior: Behavior normal.    ROS Comprehensive ROS Pertinent positive and negative noted in HPI   Last 3 Office BP readings: BP Readings from Last 3 Encounters:  01/27/23 (Abnormal) 153/103  10/27/22 (Abnormal) 144/92  05/31/22 134/89    BMET    Component Value Date/Time   NA 141 10/28/2022 1353   K 4.1 10/28/2022 1353   CL 102 10/28/2022 1353   CO2 27 10/28/2022 1353   GLUCOSE 87 10/28/2022 1353   GLUCOSE 89 07/05/2018 1916   BUN 11 10/28/2022 1353   CREATININE 0.86 10/28/2022 1353   CALCIUM 9.5 10/28/2022 1353   GFRNONAA 76 12/18/2019 0848   GFRAA 88 12/18/2019 0848    Renal function: CrCl cannot be calculated (Patient's most recent lab result is older than the maximum 21 days allowed.).  Clinical ASCVD: No  The 10-year ASCVD risk score (Arnett DK, et al., 2019) is: 6.2%   Values used to calculate the score:     Age: 68 years     Sex: Female     Is Non-Hispanic African American: Yes     Diabetic: No     Tobacco smoker: No     Systolic Blood Pressure:  153 mmHg     Is BP treated: Yes     HDL Cholesterol: 78 mg/dL     Total Cholesterol: 177 mg/dL  ASCVD risk factors include- [place]   ASSESSMENT & PLAN: Christina Stanley was seen today for hypertension.  Diagnoses and all orders for this visit:  Morbid obesity (  [place]) Discussed diet and exercise for person with BMI >25. Instructed: You must burn more calories than you eat. Losing 5 percent of your body weight should be considered a success. In the longer term, losing more than 15 percent of your body weight and staying at this weight is an extremely good result. However, keep in mind that even losing 5 percent of your body weight leads to important health benefits, so try not to get discouraged if you're not able to lose more than this. Will recheck weight in 3-6 months.   Essential hypertension BP goal - < 130/80 Explained that having normal blood pressure is the goal and medications are helping to get to goal and maintain normal blood pressure. DIET: Limit salt intake, read nutrition labels to check salt content, limit fried and high fatty foods  Avoid using multisymptom OTC cold preparations that generally contain sudafed which can rise BP. Consult with pharmacist on best cold relief products to use for persons with HTN EXERCISE Discussed incorporating exercise such as walking - 30 minutes most days of the week and can do in 10 minute intervals    Added- amlodipine (NORVASC) 10 MG tablet; Take 1 tablet (10 mg total) by mouth daily. Continue HCTZ '25mg'$  QD  Minimize salt intake. Minimize alcohol intake  Colon cancer screening -     Ambulatory referral to Gastroenterology  Chronic pain of both knees   Also, discuss weight also a contributing  Crepitus bilateral knees  Refer to ortho     Meds ordered this encounter  Medications   amLODipine (NORVASC) 10 MG tablet    Sig: Take 1 tablet (10 mg total) by mouth daily.    Dispense:  90 tablet    Refill:  1    Order Specific Question:    Supervising Provider    Answer:   Tresa Garter [[id number]]       This note has been created with Dragon speech recognition software and smart phrase technology. Any transcriptional errors are unintentional.   Kerin Perna, NP 01/27/2023, 4:09 PM

## 2023-01-28 ENCOUNTER — Telehealth: Payer: Self-pay | Admitting: Emergency Medicine

## 2023-01-28 NOTE — Telephone Encounter (Signed)
Copied from [place]. Topic: Referral - Request for Referral >> Jan 28, 2023 12:32 PM Marcellus Scott wrote: Has patient seen PCP for this complaint? Yes.   *If NO, is insurance requiring patient see PCP for this issue before PCP can refer them? Yes Referral for which specialty: Ortho Preferred provider/office: N/A Reason for referral: Pt stated she came in to see PCP yesterday and discussed a referral to ortho for pain in her knees. Pt calling to follow up on this because she does not see it on her after-visit summary.  Please advise.

## 2023-02-01 ENCOUNTER — Other Ambulatory Visit (INDEPENDENT_AMBULATORY_CARE_PROVIDER_SITE_OTHER): Payer: Self-pay | Admitting: Primary Care

## 2023-02-01 DIAGNOSIS — G8929 Other chronic pain: Secondary | ICD-10-CM

## 2023-02-03 ENCOUNTER — Encounter: Payer: Self-pay | Admitting: Gastroenterology

## 2023-02-10 ENCOUNTER — Ambulatory Visit (INDEPENDENT_AMBULATORY_CARE_PROVIDER_SITE_OTHER): Payer: Medicaid Other | Admitting: Orthopaedic Surgery

## 2023-02-10 ENCOUNTER — Ambulatory Visit (INDEPENDENT_AMBULATORY_CARE_PROVIDER_SITE_OTHER): Payer: Medicaid Other

## 2023-02-10 VITALS — Ht 66.0 in | Wt 254.0 lb

## 2023-02-10 DIAGNOSIS — M1712 Unilateral primary osteoarthritis, left knee: Secondary | ICD-10-CM

## 2023-02-10 DIAGNOSIS — Z6841 Body Mass Index (BMI) 40.0 and over, adult: Secondary | ICD-10-CM

## 2023-02-10 DIAGNOSIS — M1711 Unilateral primary osteoarthritis, right knee: Secondary | ICD-10-CM | POA: Diagnosis not present

## 2023-02-10 DIAGNOSIS — M170 Bilateral primary osteoarthritis of knee: Secondary | ICD-10-CM

## 2023-02-10 NOTE — Progress Notes (Signed)
Office Visit Note   Patient: Christina Stanley           Date of Birth: 11-10-1966           MRN: [id number] Visit Date: 02/10/2023              Requested by: Kerin Perna, NP [address],  [place] [address] PCP: Kerin Perna, NP   Assessment & Plan: Visit Diagnoses:  1. Primary osteoarthritis of right knee   2. Primary osteoarthritis of left knee   3. Body mass index 40.0-44.9, adult (HCC)     Plan: Impression is bilateral knee osteoarthritis mild to moderate.  Joint spaces are still preserved.  Discussed management strategies for arthritic knee pain including physical therapy, Voltaren gel, weight loss, knee braces, activity modifications.  Will make a referral to weight loss clinic and physical therapy.  Patient will hold off on injections for now.  The patient meets the AMA guidelines for Morbid (severe) obesity with a BMI > 40.0 and I have recommended weight loss.  Follow-Up Instructions: No follow-ups on file.   Orders:  Orders Placed This Encounter  Procedures   XR Knee 1-2 Views Right   XR Knee 1-2 Views Left   Ambulatory referral to Physical Therapy   Amb Ref to Medical Weight Management   No orders of the defined types were placed in this encounter.     Procedures: No procedures performed   Clinical Data: No additional findings.   Subjective: Chief Complaint  Patient presents with   Right Knee - Pain   Left Knee - Pain    HPI  Christina Stanley is a very pleasant 57 year old female here for evaluation of bilateral knee pain for years.  Worse in the left knee.  Denies any previous injuries or surgeries or recent changes in activity.  Patient has tried over-the-counter medications as well as compression sleeves.  Review of Systems  Constitutional: Negative.   HENT: Negative.    Eyes: Negative.   Respiratory: Negative.    Cardiovascular: Negative.   Endocrine: Negative.   Musculoskeletal: Negative.   Neurological: Negative.    Hematological: Negative.   Psychiatric/Behavioral: Negative.    All other systems reviewed and are negative.    Objective: Vital Signs: Ht 5' 6"$  (1.676 m)   Wt 254 lb (115.2 kg)   LMP  (LMP Unknown)   BMI 41.00 kg/m   Physical Exam Vitals and nursing note reviewed.  Constitutional:      Appearance: She is well-developed.  HENT:     Head: Atraumatic.     Nose: Nose normal.  Eyes:     Extraocular Movements: Extraocular movements intact.  Cardiovascular:     Pulses: Normal pulses.  Pulmonary:     Effort: Pulmonary effort is normal.  Abdominal:     Palpations: Abdomen is soft.  Musculoskeletal:     Cervical back: Neck supple.  Skin:    General: Skin is warm.     Capillary Refill: Capillary refill takes less than 2 seconds.  Neurological:     Mental Status: She is alert. Mental status is at baseline.  Psychiatric:        Behavior: Behavior normal.        Thought Content: Thought content normal.        Judgment: Judgment normal.     Ortho Exam  Examination bilateral knees shows well-preserved range of motion.  Global pain to palpation.  Collaterals and cruciates are stable.  Specialty Comments:  No specialty comments available.  Imaging: XR Knee 1-2 Views Right  Result Date: 02/10/2023 Mild to moderate osteoarthritis.  XR Knee 1-2 Views Left  Result Date: 02/10/2023 Mild to moderate osteoarthritis.    PMFS History: Patient Active Problem List   Diagnosis Date Noted   Endometrial thickening on ultrasound 07/06/2018   Anemia acute on chronic 07/05/2018   Obesity 09/14/2012   Leiomyoma of uterus, unspecified 08/10/2011   Excessive or frequent menstruation 08/10/2011   ACUTE BRONCHITIS 04/14/2009   Past Medical History:  Diagnosis Date   Anemia    Fibroids    Hyperlipidemia    Hypertension     Family History  Problem Relation Age of Onset   Diabetes Mother    Hypertension Mother    Cancer Father        colon   Diabetes Father    Hypertension  Father    Diabetes Sister    Hypertension Sister    Cancer Maternal Grandmother    Breast cancer Neg Hx     Past Surgical History:  Procedure Laterality Date   DILATION AND CURETTAGE OF UTERUS N/A 07/06/2018   Procedure: VAGINAL MUCOSA LACERATION REPAIR;  Surgeon: Chancy Milroy, MD;  Location: [facility];  Service: Gynecology;  Laterality: N/A;   ECTOPIC PREGNANCY SURGERY     two pregnancy   SALPINGECTOMY     TOTAL ABDOMINAL HYSTERECTOMY W/ BILATERAL SALPINGOOPHORECTOMY  08/23/2018   UTERINE FIBROID SURGERY  08/12/2010   diagnositic hysterscopy with D&C and removal of central fibroid.    Social History   Occupational History   Not on file  Tobacco Use   Smoking status: Never   Smokeless tobacco: Never  Vaping Use   Vaping Use: Never used  Substance and Sexual Activity   Alcohol use: Not Currently    Comment: ocassional   Drug use: No   Sexual activity: Yes    Birth control/protection: Surgical

## 2023-02-17 ENCOUNTER — Ambulatory Visit (INDEPENDENT_AMBULATORY_CARE_PROVIDER_SITE_OTHER): Payer: No Typology Code available for payment source | Admitting: Primary Care

## 2023-02-22 ENCOUNTER — Ambulatory Visit (INDEPENDENT_AMBULATORY_CARE_PROVIDER_SITE_OTHER): Payer: Medicaid Other | Admitting: Primary Care

## 2023-02-22 ENCOUNTER — Encounter (INDEPENDENT_AMBULATORY_CARE_PROVIDER_SITE_OTHER): Payer: Self-pay | Admitting: Primary Care

## 2023-02-22 VITALS — BP 146/90 | HR 74 | Resp 16 | Wt 255.2 lb

## 2023-02-22 DIAGNOSIS — I10 Essential (primary) hypertension: Secondary | ICD-10-CM | POA: Diagnosis not present

## 2023-02-22 MED ORDER — VALSARTAN 160 MG PO TABS: 160.0000 mg | ORAL_TABLET | Freq: Every day | ORAL | 3 refills | Status: AC

## 2023-02-22 NOTE — Progress Notes (Signed)
[place], is a 57 y.o. female  [id number]  [id number]  DOB - 29-Dec-1965  Chief Complaint  Patient presents with   Blood Pressure Check       Subjective:   [name] is a 57 y.o. female here today for a follow up visit for Bp. This is when she states she was unable to tolerate the amlodipine Patient has No headache, No chest pain, No abdominal pain - No Nausea, No new weakness tingling or numbness, No Cough - shortness of breath  No problems updated.  Allergies  Allergen Reactions   Egg Shells Swelling    Face and lips swelled.   Shellfish Allergy Swelling    Face and lips swelled   Amlodipine     Gi issue   Amoxicillin Rash    Has patient had a PCN reaction causing immediate rash, facial/tongue/throat swelling, SOB or lightheadedness with hypotension: No Has patient had a PCN reaction causing severe rash involving mucus membranes or skin necrosis: No Has patient had a PCN reaction that required hospitalization: No Has patient had a PCN reaction occurring within the last 10 years: No If all of the above answers are "NO", then may proceed with Cephalosporin use.     Past Medical History:  Diagnosis Date   Anemia    Fibroids    Hyperlipidemia    Hypertension     Current Outpatient Medications on File Prior to Visit  Medication Sig Dispense Refill   EPINEPHrine 0.3 mg/0.3 mL IJ SOAJ injection Inject 0.3 mg into the muscle as needed for anaphylaxis. 2 each 1   hydrochlorothiazide (HYDRODIURIL) 25 MG tablet Take 1 tablet (25 mg total) by mouth daily. 90 tablet 1   loratadine (CLARITIN) 10 MG tablet Take 1 tablet (10 mg total) by mouth daily. 90 tablet 1   pravastatin (PRAVACHOL) 10 MG tablet Take 1 tablet (10 mg total) by mouth daily. 90 tablet 3   No current facility-administered medications on file prior to visit.    Objective:   Vitals:   02/22/23 0900 02/22/23 0901  BP: (Abnormal) 151/88 (Abnormal) 146/90  Pulse: 74    Resp: 16   SpO2: 100%   Weight: 255 lb 3.2 oz (115.8 kg)     Comprehensive ROS Pertinent positive and negative noted in HPI   Exam General appearance : Awake, alert, not in any distress. Speech Clear. Not toxic looking HEENT: Atraumatic and Normocephalic, pupils equally reactive to light and accomodation Neck: Supple, no JVD. No cervical lymphadenopathy.  Chest: Good air entry bilaterally, no added sounds  CVS: S1 S2 regular, no murmurs.  Abdomen: Bowel sounds present, Non tender and not distended with no gaurding, rigidity or rebound. Extremities: B/L Lower Ext shows no edema, both legs are warm to touch Neurology: Awake alert, and oriented X 3, CN II-XII intact, Non focal Skin: No Rash  Data Review Lab Results  Component Value Date   HGBA1C 6.3 10/27/2022   HGBA1C 5.6 12/14/2018    Assessment & Plan  Christina Stanley was seen today for blood pressure check.  Diagnoses and all orders for this visit:  Essential hypertension BP goal - < 130/80 Explained that having normal blood pressure is the goal and medications are helping to get to goal and maintain normal blood pressure. DIET: Limit salt intake, read nutrition labels to check salt content, limit fried and high fatty foods  Avoid using multisymptom OTC cold preparations that generally contain sudafed which can rise BP. Consult with pharmacist  on best cold relief products to use for persons with HTN EXERCISE Discussed incorporating exercise such as walking - 30 minutes most days of the week and can do in 10 minute intervals    -     valsartan (DIOVAN) 160 MG tablet; Take 1 tablet (160 mg total) by mouth daily.     Patient have been counseled extensively about nutrition and exercise. Other issues discussed during this visit include: low cholesterol diet, weight control and daily exercise, foot care, annual eye examinations at Ophthalmology, importance of adherence with medications and regular follow-up. We also discussed long term  complications of uncontrolled diabetes and hypertension.   Return in about 3 weeks (around 03/15/2023) for Bp .  The patient was given clear instructions to go to ER or return to medical center if symptoms don't improve, worsen or new problems develop. The patient verbalized understanding. The patient was told to call to get lab results if they haven't heard anything in the next week.   This note has been created with Survey[employer], quantity. Any transcriptional errors are unintentional.   Kerin Perna, NP 02/22/2023, 9:07 AM

## 2023-02-22 NOTE — Patient Instructions (Signed)
Valsartan Tablets What is this medication? VALSARTAN (val SAR tan) treats high blood pressure and heart failure. It may also be used to prevent further damage after a heart attack. It works by relaxing the blood vessels, which decreases blood pressure and the amount of work the heart has to do. It belongs to a group of medications called ARBs. This medicine may be used for other purposes; ask your health care provider or pharmacist if you have questions. COMMON BRAND NAME(S): Diovan What should I tell my care team before I take this medication? They need to know if you have any of these conditions: Heart failure Kidney disease Liver disease An unusual or allergic reaction to valsartan, other medications, foods, dyes, or preservatives Pregnant or trying to get pregnant Breast-feeding How should I use this medication? Take this medication by mouth. Take it as directed on the prescription label at the same time every day. You can take it with or without food. If it upsets your stomach, take it with food. Keep taking it unless your care team tells you to stop. Talk to your care team about the use of this medication in children. While it may be prescribed for children as young as 1 for selected conditions, precautions do apply. Overdosage: If you think you have taken too much of this medicine contact a poison control center or emergency room at once. NOTE: This medicine is only for you. Do not share this medicine with others. What if I miss a dose? If you miss a dose, take it as soon as you can. If it is almost time for your next dose, take only that dose. Do not take double or extra doses. What may interact with this medication? Aliskiren ACE inhibitors, such as enalapril or lisinopril Diuretics, especially amiloride, eplerenone, spironolactone, or triamterene Lithium NSAIDs, medications for pain and inflammation, such as ibuprofen or naproxen Potassium salts or potassium supplements This list  may not describe all possible interactions. Give your health care provider a list of all the medicines, herbs, non-prescription drugs, or dietary supplements you use. Also tell them if you smoke, drink alcohol, or use illegal drugs. Some items may interact with your medicine. What should I watch for while using this medication? Visit your care team for regular checks on your progress. Check your blood pressure as directed. Ask your care team what your blood pressure should be. Also, find out when you should contact him or her. Do not treat yourself for coughs, colds, or pain while you are taking this medication without asking your care team for advice. Some medications may increase your blood pressure. Women should inform their care team if they wish to become pregnant or think they might be pregnant. There is a potential for serious side effects to an unborn child. Talk to your care team for more information. This medication may affect your coordination, reaction time, or judgment. Do not drive or operate machinery until you know how this medication affects you. Sit up or stand slowly to reduce the risk of dizzy or fainting spells. Drinking alcohol with this medication can increase the risk of these side effects. Avoid salt substitutes unless you are told otherwise by your care team. What side effects may I notice from receiving this medication? Side effects that you should report to your care team as soon as possible: Allergic reactions--skin rash, itching, hives, swelling of the face, lips, tongue, or throat High potassium level--muscle weakness, fast or irregular heartbeat Kidney injury--decrease in the amount of  urine, swelling of the ankles, hands, or feet Low blood pressure--dizziness, feeling faint or lightheaded, blurry vision Side effects that usually do not require medical attention (report to your care team if they continue or are bothersome): Dizziness Fatigue Headache This list may not  describe all possible side effects. Call your doctor for medical advice about side effects. You may report side effects to FDA at 1-800-FDA-1088. Where should I keep my medication? Keep out of the reach of children and pets. Store at room temperature between 20 and 25 degrees C (68 and 77 degrees F). Protect from moisture. Keep the container tightly closed. Get rid of any unused medication after the expiration date. To get rid of medications that are no longer needed or have expired: Take the medication to a medication take-back program. Check with your pharmacy or law enforcement to find a location. If you cannot return the medication, check the label or package insert to see if the medication should be thrown out in the garbage or flushed down the toilet. If you are not sure, ask your care team. If it is safe to put it in the trash, take the medication out of the container. Mix the medication with cat litter, dirt, coffee grounds, or other unwanted substance. Seal the mixture in a bag or container. Put it in the trash. NOTE: This sheet is a summary. It may not cover all possible information. If you have questions about this medicine, talk to your doctor, pharmacist, or health care provider.  2023 Elsevier/Gold Standard (2008-02-03 00:00:00)

## 2023-03-01 ENCOUNTER — Ambulatory Visit: Payer: Medicaid Other | Attending: Orthopaedic Surgery | Admitting: Physical Therapy

## 2023-03-09 ENCOUNTER — Encounter: Payer: Self-pay | Admitting: Gastroenterology

## 2023-03-09 ENCOUNTER — Ambulatory Visit (INDEPENDENT_AMBULATORY_CARE_PROVIDER_SITE_OTHER): Payer: Medicaid Other | Admitting: Gastroenterology

## 2023-03-09 ENCOUNTER — Encounter (INDEPENDENT_AMBULATORY_CARE_PROVIDER_SITE_OTHER): Payer: Self-pay

## 2023-03-09 VITALS — BP 130/90 | HR 67 | Ht 66.0 in | Wt 254.0 lb

## 2023-03-09 DIAGNOSIS — K219 Gastro-esophageal reflux disease without esophagitis: Secondary | ICD-10-CM

## 2023-03-09 DIAGNOSIS — Z800 Family history of malignant neoplasm of digestive organs: Secondary | ICD-10-CM | POA: Diagnosis not present

## 2023-03-09 DIAGNOSIS — K5900 Constipation, unspecified: Secondary | ICD-10-CM

## 2023-03-09 DIAGNOSIS — Z86010 Personal history of colonic polyps: Secondary | ICD-10-CM | POA: Diagnosis not present

## 2023-03-09 MED ORDER — FAMOTIDINE 20 MG PO TABS: 20.0000 mg | ORAL_TABLET | Freq: Two times a day (BID) | ORAL | 3 refills | Status: DC | PRN

## 2023-03-09 NOTE — Progress Notes (Signed)
HPI : Christina Stanley is a very pleasant 57 year old female with a history of HTN and HLP who is referred to [place] by Juluis Mire, NP for colon cancer screening.  She has previously done fecal occult blood testing, last in 2020. She has a history of iron deficiency anemia attributed to menorrhagia from uterine fibroids.  Her father was diagnosed with colon cancer around age 40.  No other family members with CRC.  She has chronic constipation.  She has a bowel movement maybe once or twice a week.  She takes stool softeners as needed. Straining is common. Stools are frequently hard, but sometimes soft.  No blood.  She had a colonoscopy over 10 years ago at [facility].  She reportedly had polyps removed.   She has occasional acid reflux/heartburn and has taken Nexium in the past.  She estimates having symptoms maybe 2-3 days per week, usually when she eats something heavy.   Past Medical History:  Diagnosis Date   Anemia    Fibroids    Hyperlipidemia    Hypertension      Past Surgical History:  Procedure Laterality Date   DILATION AND CURETTAGE OF UTERUS N/A 07/06/2018   Procedure: VAGINAL MUCOSA LACERATION REPAIR;  Surgeon: Chancy Milroy, MD;  Location: [facility];  Service: Gynecology;  Laterality: N/A;   ECTOPIC PREGNANCY SURGERY     two pregnancy   SALPINGECTOMY     TOTAL ABDOMINAL HYSTERECTOMY W/ BILATERAL SALPINGOOPHORECTOMY  08/23/2018   UTERINE FIBROID SURGERY  08/12/2010   diagnositic hysterscopy with D&C and removal of central fibroid.    Family History  Problem Relation Age of Onset   Diabetes Mother    Hypertension Mother    Cancer Father        colon   Diabetes Father    Hypertension Father    Diabetes Sister    Hypertension Sister    Cancer Maternal Grandmother    Breast cancer Neg Hx    Social History   Tobacco Use   Smoking status: Never   Smokeless tobacco: Never  Vaping Use   Vaping Use: Never used  Substance Use Topics   Alcohol use: Not  Currently    Comment: ocassional   Drug use: No   Current Outpatient Medications  Medication Sig Dispense Refill   EPINEPHrine 0.3 mg/0.3 mL IJ SOAJ injection Inject 0.3 mg into the muscle as needed for anaphylaxis. 2 each 1   hydrochlorothiazide (HYDRODIURIL) 25 MG tablet Take 1 tablet (25 mg total) by mouth daily. 90 tablet 1   loratadine (CLARITIN) 10 MG tablet Take 1 tablet (10 mg total) by mouth daily. 90 tablet 1   pravastatin (PRAVACHOL) 10 MG tablet Take 1 tablet (10 mg total) by mouth daily. 90 tablet 3   valsartan (DIOVAN) 160 MG tablet Take 1 tablet (160 mg total) by mouth daily. 90 tablet 3   No current facility-administered medications for this visit.   Allergies  Allergen Reactions   Egg Shells Swelling    Face and lips swelled.   Shellfish Allergy Swelling    Face and lips swelled   Amlodipine     Gi issue   Amoxicillin Rash    Has patient had a PCN reaction causing immediate rash, facial/tongue/throat swelling, SOB or lightheadedness with hypotension: No Has patient had a PCN reaction causing severe rash involving mucus membranes or skin necrosis: No Has patient had a PCN reaction that required hospitalization: No Has patient had a PCN reaction occurring  within the last 10 years: No If all of the above answers are "NO", then may proceed with Cephalosporin use.      Review of Systems: All systems reviewed and negative except where noted in HPI.    XR Knee 1-2 Views Right  Result Date: 02/10/2023 Mild to moderate osteoarthritis.  XR Knee 1-2 Views Left  Result Date: 02/10/2023 Mild to moderate osteoarthritis.   Physical Exam: Ht '5\' 6"'$  (1.676 m)   Wt 254 lb (115.2 kg)   LMP  (LMP Unknown)   BMI 41.00 kg/m  Constitutional: Pleasant,well-developed, African American female in no acute distress. HEENT: Normocephalic and atraumatic. Conjunctivae are normal. No scleral icterus.  Multiple missing teeth Neck supple.  Cardiovascular: Normal rate, regular  rhythm.  Pulmonary/chest: Effort normal and breath sounds normal. No wheezing, rales or rhonchi. Abdominal: Soft, nondistended, nontender. Bowel sounds active throughout. There are no masses palpable. No hepatomegaly. Extremities: no edema Neurological: Alert and oriented to person place and time. Skin: Skin is warm and dry. No rashes noted. Psychiatric: Normal mood and affect. Behavior is normal.  CBC    Component Value Date/Time   WBC 6.1 10/28/2022 1353   WBC 11.5 (H) 07/06/2018 0620   RBC 5.06 10/28/2022 1353   RBC 4.41 07/06/2018 0620   HGB 11.8 10/28/2022 1353   HCT 37.2 10/28/2022 1353   PLT 279 10/28/2022 1353   MCV 74 (L) 10/28/2022 1353   MCH 23.3 (L) 10/28/2022 1353   MCH 19.7 (L) 07/06/2018 0620   MCHC 31.7 10/28/2022 1353   MCHC 29.7 (L) 07/06/2018 0620   RDW 15.2 10/28/2022 1353   LYMPHSABS 2.1 10/28/2022 1353   MONOABS 0.3 07/06/2018 0620   EOSABS 0.3 10/28/2022 1353   BASOSABS 0.0 10/28/2022 1353    CMP     Component Value Date/Time   NA 141 10/28/2022 1353   K 4.1 10/28/2022 1353   CL 102 10/28/2022 1353   CO2 27 10/28/2022 1353   GLUCOSE 87 10/28/2022 1353   GLUCOSE 89 07/05/2018 1916   BUN 11 10/28/2022 1353   CREATININE 0.86 10/28/2022 1353   CALCIUM 9.5 10/28/2022 1353   PROT 7.6 10/28/2022 1353   ALBUMIN 4.4 10/28/2022 1353   AST 19 10/28/2022 1353   ALT 13 10/28/2022 1353   ALKPHOS 92 10/28/2022 1353   BILITOT 0.4 10/28/2022 1353   GFRNONAA 76 12/18/2019 0848   GFRAA 88 12/18/2019 0848     ASSESSMENT AND PLAN:  57 year old female with family history of colon cancer (father, 35s) and reported personal history of colon polyps (per patient, details unknown) overdue for colonoscopy.  She has chronic constipation but no other chronic lower GI symptoms.  I recommended she take Metamucil daily to improve her stool bulk and frequency.   We discussed a 2-day prep to improve the chances of an adequate colon cleansing, but because she works full  time, this would be difficult.  I therefore recommended she take MiraLax twice daily and Senna 2 tabs qhs everyday for a week leading up to her colonoscopy. Regarding her heartburn, I recommended she try taking Pepcid as needed.  A daily PPI is likely not necessary given the frequency of her symptoms. Will request records from Dr. Ulyses Amor office to get details of patient's previous colonoscopy  Family history of colon cancer/personal history of polyps - Colonoscopy - Request records from Dr. Ulyses Amor office.  Constipation - Daily Metamucil - Twice daily MiraLax and Senna 2 tabs qhs for a week prior to colonoscopy  to improve chances of adequate prep  GERD - Pepcid 20 mg PO Q12h PRN    Kerin Perna, NP

## 2023-03-09 NOTE — Patient Instructions (Addendum)
_______________________________________________________  If your blood pressure at your visit was 140/90 or greater, please contact your primary care physician to follow up on this.  _______________________________________________________  If you are age 57 or older, your body mass index should be between 23-30. Your Body mass index is 41 kg/m. If this is out of the aforementioned range listed, please consider follow up with your Primary Care Provider.  If you are age 23 or younger, your body mass index should be between 19-25. Your Body mass index is 41 kg/m. If this is out of the aformentioned range listed, please consider follow up with your Primary Care Provider.   You have been scheduled for a colonoscopy. Please follow written instructions given to you at your visit today.  Please pick up your prep supplies at the pharmacy within the next 1-3 days. If you use inhalers (even only as needed), please bring them with you on the day of your procedure.  Start Miralax twice daily and senna two tablets daily the week before your Colonoscopy.  Start Metamucil daily.  We have sent the following medications to your pharmacy for you to pick up at your convenience: Pepcid 20 mg   ________________________________________________________  The [facility] providers would like to encourage you to use [facility] to communicate with providers for non-urgent requests or questions.  Due to long hold times on the telephone, sending your provider a message by [facility] may be a faster and more efficient way to get a response.  Please allow 48 business hours for a response.  Please remember that this is for non-urgent requests.  _______________________________________________________

## 2023-03-17 ENCOUNTER — Ambulatory Visit (INDEPENDENT_AMBULATORY_CARE_PROVIDER_SITE_OTHER): Payer: Medicaid Other | Admitting: Primary Care

## 2023-03-21 ENCOUNTER — Other Ambulatory Visit: Payer: Self-pay

## 2023-03-21 ENCOUNTER — Telehealth: Payer: Self-pay | Admitting: Gastroenterology

## 2023-03-21 MED ORDER — NA SULFATE-K SULFATE-MG SULF 17.5-3.13-1.6 GM/177ML PO SOLN: 1.0000 | Freq: Once | ORAL | 0 refills | Status: AC

## 2023-03-21 NOTE — Telephone Encounter (Signed)
Suprep has been sent to patients pharmacy. 

## 2023-03-21 NOTE — Telephone Encounter (Signed)
Inbound call from patient stating she still needs prep medication sent into the [facility] [address] in [place].  Please advise.

## 2023-03-22 ENCOUNTER — Ambulatory Visit: Payer: Medicaid Other | Admitting: Physical Therapy

## 2023-03-31 ENCOUNTER — Encounter: Payer: Self-pay | Admitting: Gastroenterology

## 2023-03-31 ENCOUNTER — Ambulatory Visit (AMBULATORY_SURGERY_CENTER): Payer: Medicaid Other | Admitting: Gastroenterology

## 2023-03-31 VITALS — BP 140/93 | HR 66 | Temp 97.1°F | Resp 11 | Ht 66.0 in | Wt 254.0 lb

## 2023-03-31 DIAGNOSIS — Z800 Family history of malignant neoplasm of digestive organs: Secondary | ICD-10-CM | POA: Diagnosis not present

## 2023-03-31 DIAGNOSIS — Z1211 Encounter for screening for malignant neoplasm of colon: Secondary | ICD-10-CM

## 2023-03-31 MED ORDER — SODIUM CHLORIDE 0.9 % IV SOLN: 500.0000 mL | INTRAVENOUS | Status: DC

## 2023-03-31 NOTE — Patient Instructions (Signed)
Resume previous diet and medications. Repeat Colonoscopy in 10 years for surveillance.  YOU HAD AN ENDOSCOPIC PROCEDURE TODAY AT THE [facility]:   Refer to the procedure report that was given to you for any specific questions about what was found during the examination.  If the procedure report does not answer your questions, please call your gastroenterologist to clarify.  If you requested that your care partner not be given the details of your procedure findings, then the procedure report has been included in a sealed envelope for you to review at your convenience later.  YOU SHOULD EXPECT: Some feelings of bloating in the abdomen. Passage of more gas than usual.  Walking can help get rid of the air that was put into your GI tract during the procedure and reduce the bloating. If you had a lower endoscopy (such as a colonoscopy or flexible sigmoidoscopy) you may notice spotting of blood in your stool or on the toilet paper. If you underwent a bowel prep for your procedure, you may not have a normal bowel movement for a few days.  Please Note:  You might notice some irritation and congestion in your nose or some drainage.  This is from the oxygen used during your procedure.  There is no need for concern and it should clear up in a day or so.  SYMPTOMS TO REPORT IMMEDIATELY:  Following lower endoscopy (colonoscopy or flexible sigmoidoscopy):  Excessive amounts of blood in the stool  Significant tenderness or worsening of abdominal pains  Swelling of the abdomen that is new, acute  Fever of 100F or higher  For urgent or emergent issues, a gastroenterologist can be reached at any hour by calling [phone]. Do not use MyChart messaging for urgent concerns.    DIET:  We do recommend a small meal at first, but then you may proceed to your regular diet.  Drink plenty of fluids but you should avoid alcoholic beverages for 24 hours.  ACTIVITY:  You should plan to take it easy for the  rest of today and you should NOT DRIVE or use heavy machinery until tomorrow (because of the sedation medicines used during the test).    FOLLOW UP: Our staff will call the number listed on your records the next business day following your procedure.  We will call around 7:15- 8:00 am to check on you and address any questions or concerns that you may have regarding the information given to you following your procedure. If we do not reach you, we will leave a message.     If any biopsies were taken you will be contacted by phone or by letter within the next 1-3 weeks.  Please call us at [phone] if you have not heard about the biopsies in 3 weeks.    SIGNATURES/CONFIDENTIALITY: You and/or your care partner have signed paperwork which will be entered into your electronic medical record.  These signatures attest to the fact that that the information above on your After Visit Summary has been reviewed and is understood.  Full responsibility of the confidentiality of this discharge information lies with you and/or your care-partner.  

## 2023-03-31 NOTE — Progress Notes (Signed)
History and Physical Interval Note:  03/31/2023 4:14 PM  Christina Stanley  has presented today for endoscopic procedure(s), with the diagnosis of  Encounter Diagnosis  Name Primary?   Family history of colon cancer Yes  .  The various methods of evaluation and treatment have been discussed with the patient and/or family. After consideration of risks, benefits and other options for treatment, the patient has consented to  the endoscopic procedure(s).   The patient's history has been reviewed, patient examined, no change in status, stable for endoscopic procedure(s).  I have reviewed the patient's chart and labs.  Questions were answered to the patient's satisfaction.     Ilee Randleman E. Candis Schatz, MD [facility]

## 2023-03-31 NOTE — Progress Notes (Signed)
Pt's states no medical or surgical changes since previsit or office visit. 

## 2023-03-31 NOTE — Progress Notes (Signed)
Sedate, gd SR, tolerated procedure well, VSS, report to RN 

## 2023-03-31 NOTE — Op Note (Signed)
[facility] Patient Name: Christina Stanley Procedure Date: 03/31/2023 4:12 PM MRN: [id number] Endoscopist: Nicki Reaper E. Candis Schatz , MD, [id number] Age: 57 Referring MD:  Date of Birth: 1966-01-13 Gender: Female Account #: [id number] Procedure:                Colonoscopy Indications:              Screening in patient at increased risk: Colorectal                            cancer in father 57 or older Medicines:                Monitored Anesthesia Care Procedure:                Pre-Anesthesia Assessment:                           - Prior to the procedure, a History and Physical                            was performed, and patient medications and                            allergies were reviewed. The patient's tolerance of                            previous anesthesia was also reviewed. The risks                            and benefits of the procedure and the sedation                            options and risks were discussed with the patient.                            All questions were answered, and informed consent                            was obtained. Prior Anticoagulants: The patient has                            taken no anticoagulant or antiplatelet agents. ASA                            Grade Assessment: II - A patient with mild systemic                            disease. After reviewing the risks and benefits,                            the patient was deemed in satisfactory condition to                            undergo the procedure.  After obtaining informed consent, the colonoscope                            was passed under direct vision. Throughout the                            procedure, the patient's blood pressure, pulse, and                            oxygen saturations were monitored continuously. The                            CF [id number] was introduced through the anus                            and advanced to the the  cecum, identified by                            appendiceal orifice and ileocecal valve. The                            colonoscopy was unusually difficult due to a                            redundant colon, significant looping and a tortuous                            colon. Successful completion of the procedure was                            aided by using manual pressure and withdrawing and                            reinserting the scope. The patient tolerated the                            procedure well. The quality of the bowel                            preparation was good. The ileocecal valve,                            appendiceal orifice, and rectum were photographed.                            The bowel preparation used was SUPREP via split                            dose instruction. Scope In: 4:19:20 PM Scope Out: 4:48:04 PM Scope Withdrawal Time: 0 hours 6 minutes 18 seconds  Total Procedure Duration: 0 hours 28 minutes 44 seconds  Findings:                 The perianal and digital rectal examinations were  normal.                           Multiple medium-mouthed and small-mouthed                            diverticula were found in the sigmoid colon and                            descending colon. There was no evidence of                            diverticular bleeding.                           The exam was otherwise normal throughout the                            examined colon.                           Non-bleeding internal hemorrhoids were found during                            retroflexion. The hemorrhoids were Grade I                            (internal hemorrhoids that do not prolapse).                           No additional abnormalities were found on                            retroflexion. Complications:            No immediate complications. Estimated Blood Loss:     Estimated blood loss: none. Impression:               -  Moderate diverticulosis in the sigmoid colon and                            in the descending colon. There was no evidence of                            diverticular bleeding.                           - Non-bleeding internal hemorrhoids.                           - No specimens collected.                           - The GI Genius (intelligent endoscopy module),                            computer-aided polyp detection system powered by AI  was utilized to detect colorectal polyps through                            enhanced visualization during colonoscopy. Recommendation:           - Patient has a contact number available for                            emergencies. The signs and symptoms of potential                            delayed complications were discussed with the                            patient. Return to normal activities tomorrow.                            Written discharge instructions were provided to the                            patient.                           - Resume previous diet.                           - Continue present medications.                           - Repeat colonoscopy in 10 years for screening                            purposes.                           - Recommend high fiber diet or daily fiber                            supplementation. Shaquasha Gerstel E. Candis Schatz, MD 03/31/2023 4:53:25 PM This report has been signed electronically.

## 2023-04-01 ENCOUNTER — Encounter: Payer: Self-pay | Admitting: Family Medicine

## 2023-04-01 ENCOUNTER — Telehealth: Payer: Self-pay | Admitting: *Deleted

## 2023-04-01 DIAGNOSIS — Z1231 Encounter for screening mammogram for malignant neoplasm of breast: Secondary | ICD-10-CM

## 2023-04-01 NOTE — Telephone Encounter (Signed)
Left message on f/u call 

## 2023-04-07 ENCOUNTER — Other Ambulatory Visit: Payer: Self-pay | Admitting: *Deleted

## 2023-04-07 DIAGNOSIS — Z1231 Encounter for screening mammogram for malignant neoplasm of breast: Secondary | ICD-10-CM

## 2023-05-11 ENCOUNTER — Encounter (HOSPITAL_COMMUNITY): Payer: Self-pay

## 2023-05-11 ENCOUNTER — Ambulatory Visit (INDEPENDENT_AMBULATORY_CARE_PROVIDER_SITE_OTHER): Payer: Medicaid Other

## 2023-05-11 ENCOUNTER — Telehealth: Payer: Self-pay | Admitting: Primary Care

## 2023-05-11 ENCOUNTER — Ambulatory Visit (HOSPITAL_COMMUNITY)
Admission: EM | Admit: 2023-05-11 | Discharge: 2023-05-11 | Disposition: A | Discharge status: Home / Self Care | Payer: Medicaid Other | Attending: Emergency Medicine | Admitting: Emergency Medicine

## 2023-05-11 DIAGNOSIS — M25561 Pain in right knee: Secondary | ICD-10-CM

## 2023-05-11 DIAGNOSIS — M1711 Unilateral primary osteoarthritis, right knee: Secondary | ICD-10-CM | POA: Diagnosis not present

## 2023-05-11 MED ORDER — ACETAMINOPHEN 500 MG PO TABS: 500.0000 mg | ORAL_TABLET | Freq: Four times a day (QID) | ORAL | 0 refills | Status: AC | PRN

## 2023-05-11 NOTE — ED Provider Notes (Signed)
[facility]    CSN: [id number] Arrival date & time: 05/11/23  1642      History   Chief Complaint Chief Complaint  Patient presents with   Knee Pain    HPI Christina Stanley is a 57 y.o. female.   Patient reports right knee pain that got worse over the weekend.  She was trying to get up from a sitting position to wash dishes when she felt a sudden pop in her right knee and sudden pain.  Reports her knee does twist, does this occasionally when she stands up, this is not uncommon.  She does have a history of osteoarthritis in both knees.  She is ambulatory in clinic.  Does have a copper fit knee brace as well as a knee brace that goes over top of this 1.  She has been icing her knees with some relief.  Did have left knee swelling over the weekend, no new injuries to this knee.   The history is provided by the patient and medical records.  Knee Pain   Past Medical History:  Diagnosis Date   Anemia    Fibroids    Hyperlipidemia    Hypertension     Patient Active Problem List   Diagnosis Date Noted   Endometrial thickening on ultrasound 07/06/2018   Anemia acute on chronic 07/05/2018   Obesity 09/14/2012   Leiomyoma of uterus, unspecified 08/10/2011   Excessive or frequent menstruation 08/10/2011   ACUTE BRONCHITIS 04/14/2009    Past Surgical History:  Procedure Laterality Date   DILATION AND CURETTAGE OF UTERUS N/A 07/06/2018   Procedure: VAGINAL MUCOSA LACERATION REPAIR;  Surgeon: Hermina Staggers, MD;  Location: [facility];  Service: Gynecology;  Laterality: N/A;   ECTOPIC PREGNANCY SURGERY     two pregnancy   SALPINGECTOMY     TOTAL ABDOMINAL HYSTERECTOMY W/ BILATERAL SALPINGOOPHORECTOMY  08/23/2018   UTERINE FIBROID SURGERY  08/12/2010   diagnositic hysterscopy with D&C and removal of central fibroid.     OB History     Gravida  3   Para  1   Term  1   Preterm  0   AB  2   Living  1      SAB  0   IAB  0   Ectopic  2   Multiple       Live Births               Home Medications    Prior to Admission medications   Medication Sig Start Date End Date Taking? Authorizing Provider  acetaminophen (TYLENOL) 500 MG tablet Take 1 tablet (500 mg total) by mouth every 6 (six) hours as needed. 05/11/23  Yes Rinaldo Ratel, [place], FNP  EPINEPHrine 0.3 mg/0.3 mL IJ SOAJ injection Inject 0.3 mg into the muscle as needed for anaphylaxis. Patient not taking: Reported on 03/31/2023 05/31/22   Grayce Sessions, NP  hydrochlorothiazide (HYDRODIURIL) 25 MG tablet Take 1 tablet (25 mg total) by mouth daily. 11/30/22   Grayce Sessions, NP  loratadine (CLARITIN) 10 MG tablet Take 1 tablet (10 mg total) by mouth daily. Patient not taking: Reported on 03/31/2023 05/31/22   Grayce Sessions, NP  metFORMIN (GLUCOPHAGE) 500 MG tablet Take 500 mg by mouth at bedtime. 03/02/23   [provider]  pravastatin (PRAVACHOL) 10 MG tablet Take 1 tablet (10 mg total) by mouth daily. 11/30/22   Grayce Sessions, NP  valsartan (DIOVAN) 160 MG tablet Take 1 tablet (  160 mg total) by mouth daily. 02/22/23   Grayce Sessions, NP    Family History Family History  Problem Relation Age of Onset   Diabetes Mother    Hypertension Mother    Colon cancer Father    Cancer Father        colon   Diabetes Father    Hypertension Father    Diabetes Sister    Hypertension Sister    Cancer Maternal Grandmother    Breast cancer Neg Hx    Liver disease Neg Hx    Esophageal cancer Neg Hx    Rectal cancer Neg Hx    Stomach cancer Neg Hx     Social History Social History   Tobacco Use   Smoking status: Never   Smokeless tobacco: Never  Vaping Use   Vaping Use: Never used  Substance Use Topics   Alcohol use: Not Currently    Comment: ocassional   Drug use: No     Allergies   Egg shells, Shellfish allergy, Amlodipine, and Amoxicillin   Review of Systems Review of Systems  Musculoskeletal:  Positive for arthralgias, gait problem and joint  swelling.     Physical Exam Triage Vital Signs ED Triage Vitals [05/11/23 1717]  Enc Vitals Group     BP (!) 141/88     Pulse Rate 75     Resp 18     Temp 98.3 F (36.8 C)     Temp Source Oral     SpO2 94 %     Weight      Height      Head Circumference      Peak Flow      Pain Score      Pain Loc      Pain Edu?      Excl. in GC?    No data found.  Updated Vital Signs BP (!) 141/88 (BP Location: Left Arm)   Pulse 75   Temp 98.3 F (36.8 C) (Oral)   Resp 18   LMP  (LMP Unknown)   SpO2 94%   Visual Acuity Right Eye Distance:   Left Eye Distance:   Bilateral Distance:    Right Eye Near:   Left Eye Near:    Bilateral Near:     Physical Exam Vitals and nursing note reviewed.  Constitutional:      Appearance: Normal appearance.  HENT:     Head: Normocephalic and atraumatic.     Right Ear: External ear normal.     Left Ear: External ear normal.     Nose: Nose normal.     Mouth/Throat:     Mouth: Mucous membranes are moist.  Eyes:     Conjunctiva/sclera: Conjunctivae normal.  Cardiovascular:     Rate and Rhythm: Normal rate.  Pulmonary:     Effort: Pulmonary effort is normal. No respiratory distress.  Musculoskeletal:        General: No swelling, tenderness, deformity or signs of injury. Normal range of motion.     Right lower leg: No edema.     Left lower leg: No edema.  Skin:    General: Skin is warm and dry.     Capillary Refill: Capillary refill takes less than 2 seconds.  Neurological:     General: No focal deficit present.     Mental Status: She is alert and oriented to person, place, and time.  Psychiatric:        Mood and Affect: Mood normal.  Behavior: Behavior normal.      UC Treatments / Results  Labs (all labs ordered are listed, but only abnormal results are displayed) Labs Reviewed - No data to display  EKG   Radiology DG Knee 2 Views Right  Result Date: 05/11/2023 CLINICAL DATA:  knee pain, swelling EXAM: RIGHT KNEE  - 1-2 VIEW COMPARISON:  Lateral right knee radiograph 02/10/2023 FINDINGS: There is no evidence of acute fracture. There is tricompartment osteophyte formation with moderate medial and patellofemoral joint space narrowing. No significant joint effusion. IMPRESSION: Tricompartment osteoarthritis, worst in the medial and patellofemoral compartments with moderate joint space narrowing. No evidence of acute fracture or significant joint effusion. Electronically Signed   By: Caprice Renshaw M.D.   On: 05/11/2023 18:12    Procedures Procedures (including critical care time)  Medications Ordered in UC Medications - No data to display  Initial Impression / Assessment and Plan / UC Course  I have reviewed the triage vital signs and the nursing notes.  Pertinent labs & imaging results that were available during my care of the patient were reviewed by me and considered in my medical decision making (see chart for details).  Vitals and triage reviewed, patient is hemodynamically stable.  Right knee without any obvious deformity, range of motion intact.  History of osteoarthritis, right knee imaging in clinic worsening osteoarthritis, no acute fracture or dislocation.  Patient already has brace.  Has an orthopedic, would like a different opinion, given information for [facility].  Advised Tylenol arthritis as needed.  Plan of care, follow-up care, and return precautions discussed, no questions at this time.     Final Clinical Impressions(s) / UC Diagnoses   Final diagnoses:  Osteoarthritis of right knee, unspecified osteoarthritis type  Right knee pain, unspecified chronicity     Discharge Instructions      Your x-rays were negative for acute fracture or dislocation, it did show worsening osteoarthritis.  Please continue with the bracing, rest, ice and elevate your knee.  You can use the Tylenol every 6 hours as needed for pain.  I have given you information for [facility], due to your insurance you  will most likely have to go through your primary care provider to get that referral.  Please return to clinic for any new or concerning changes.     ED Prescriptions     Medication Sig Dispense Auth. Provider   acetaminophen (TYLENOL) 500 MG tablet Take 1 tablet (500 mg total) by mouth every 6 (six) hours as needed. 30 tablet Kelise Kuch, [place], [place]      PDMP not reviewed this encounter.   Danaya Geddis, [place] N, [place] 05/11/23 [phone]

## 2023-05-11 NOTE — Telephone Encounter (Signed)
Copied from CRM [id number]. Topic: Referral - Request for Referral >> May 11, 2023  3:52 PM Everette C wrote: Has patient seen PCP for this complaint? Yes.   *If NO, is insurance requiring patient see PCP for this issue before PCP can refer them? Referral for which specialty: Radiology  Preferred provider/office: Patient would like to go to [facility]  Reason for referral: patient would like an xray of their right knee

## 2023-05-11 NOTE — ED Notes (Signed)
Reviewed work note 

## 2023-05-11 NOTE — Discharge Instructions (Addendum)
Your x-rays were negative for acute fracture or dislocation, it did show worsening osteoarthritis.  Please continue with the bracing, rest, ice and elevate your knee.  You can use the Tylenol every 6 hours as needed for pain.  I have given you information for [facility], due to your insurance you will most likely have to go through your primary care provider to get that referral.  Please return to clinic for any new or concerning changes.

## 2023-05-11 NOTE — ED Triage Notes (Signed)
Patient c/o right knee pain x 2 weeks and worse in the past 2 -3 days, Patient states she got up to walk and had a sharp pain.  Patient states she is currently wearing 2 knee braces.  Patient states she has been taking Ibuprofen 400 mg at 0800 today.

## 2023-05-12 NOTE — Telephone Encounter (Signed)
Pt went to the urgent care yesterday for knee pain and received xrays

## 2023-05-17 ENCOUNTER — Ambulatory Visit
Admission: RE | Admit: 2023-05-17 | Discharge: 2023-05-17 | Disposition: A | Discharge status: Home / Self Care | Payer: Medicaid Other | Source: Ambulatory Visit | Attending: *Deleted | Admitting: *Deleted

## 2023-05-17 DIAGNOSIS — Z1231 Encounter for screening mammogram for malignant neoplasm of breast: Secondary | ICD-10-CM

## 2024-01-05 ENCOUNTER — Ambulatory Visit (INDEPENDENT_AMBULATORY_CARE_PROVIDER_SITE_OTHER): Payer: Medicaid Other

## 2024-01-05 ENCOUNTER — Encounter (HOSPITAL_COMMUNITY): Payer: Self-pay | Admitting: Emergency Medicine

## 2024-01-05 ENCOUNTER — Ambulatory Visit (HOSPITAL_COMMUNITY)
Admission: EM | Admit: 2024-01-05 | Discharge: 2024-01-05 | Disposition: A | Discharge status: Home / Self Care | Payer: Medicaid Other | Attending: Internal Medicine | Admitting: Internal Medicine

## 2024-01-05 DIAGNOSIS — S39012A Strain of muscle, fascia and tendon of lower back, initial encounter: Secondary | ICD-10-CM

## 2024-01-05 DIAGNOSIS — S161XXA Strain of muscle, fascia and tendon at neck level, initial encounter: Secondary | ICD-10-CM | POA: Diagnosis not present

## 2024-01-05 MED ORDER — BACLOFEN 10 MG PO TABS: 10.0000 mg | ORAL_TABLET | Freq: Three times a day (TID) | ORAL | 0 refills | Status: AC
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
## 2024-01-05 NOTE — Discharge Instructions (Addendum)
 Apply ice on areas of pain for 20 minutes 2-4 times a day for 2 days, after that alternate with heat.  Follow up with your PCP next week.

## 2024-03-07 ENCOUNTER — Other Ambulatory Visit: Payer: Self-pay | Admitting: Family Medicine

## 2024-03-07 DIAGNOSIS — Z1231 Encounter for screening mammogram for malignant neoplasm of breast: Secondary | ICD-10-CM

## 2024-05-05 NOTE — Progress Notes (Unsigned)
 Cardiology Office Note:    Date:  05/09/2024   ID:  Christina Stanley, DOB [birth year], MRN [id number]  PCP:  Marius Siemens, NP   [facility] Providers Cardiologist:  None     Referring MD: Berneda Bridges, MD   Chief Complaint  Patient presents with   Palpitations    History of Present Illness:    Christina Stanley is a 58 y.o. female is seen at the request of Dr Leann Prom for evaluation of palpitations and general cardiac evaluation. She has a history of DM, HTN, and HLD. She works as a Water engineer. She states in general she is doing well. She has had some minor palpitations in the past but this has been infrequent and she hasn't had any recently. She stays on the go with her work. No prior cardiac evaluation. Did have some chest pain a few weeks ago. Stated it lasted for a week and went away with muscle relaxer.   Past Medical History:  Diagnosis Date   Anemia    Arthritis    Back pain with radiation    CKD (chronic kidney disease)    DM (diabetes mellitus) (HCC)    Fibroids    Hyperlipidemia    Hypertension    Palpitations    Tinea unguium    Vitamin D deficiency     Past Surgical History:  Procedure Laterality Date   DILATION AND CURETTAGE OF UTERUS N/A 07/06/2018   Procedure: VAGINAL MUCOSA LACERATION REPAIR;  Surgeon: Othelia Blinks, MD;  Location: [facility];  Service: Gynecology;  Laterality: N/A;   ECTOPIC PREGNANCY SURGERY     two pregnancy   SALPINGECTOMY     TOTAL ABDOMINAL HYSTERECTOMY W/ BILATERAL SALPINGOOPHORECTOMY  08/23/2018   UTERINE FIBROID SURGERY  08/12/2010   diagnositic hysterscopy with D&C and removal of central fibroid.     Current Medications: Current Meds  Medication Sig   acetaminophen  (TYLENOL ) 500 MG tablet Take 1 tablet (500 mg total) by mouth every 6 (six) hours as needed.   baclofen  (LIORESAL ) 10 MG tablet Take 1 tablet (10 mg total) by mouth 3 (three) times daily.   hydrochlorothiazide  (HYDRODIURIL ) 25 MG tablet Take 1 tablet  (25 mg total) by mouth daily.   metFORMIN (GLUCOPHAGE) 500 MG tablet Take 500 mg by mouth at bedtime.   pravastatin  (PRAVACHOL ) 10 MG tablet Take 1 tablet (10 mg total) by mouth daily.   valsartan  (DIOVAN ) 160 MG tablet Take 1 tablet (160 mg total) by mouth daily.     Allergies:   Egg shells, Shellfish allergy, Amlodipine , and Amoxicillin   Social History   Socioeconomic History   Marital status: Single    Spouse name: Not on file   Number of children: 1   Years of education: Not on file   Highest education level: High school graduate  Occupational History   Occupation: Occupational psychologist  Tobacco Use   Smoking status: Never   Smokeless tobacco: Never  Vaping Use   Vaping status: Never Used  Substance and Sexual Activity   Alcohol use: Not Currently    Comment: ocassional   Drug use: No   Sexual activity: Yes    Birth control/protection: Surgical  Other Topics Concern   Not on file  Social History Narrative   Works as Water engineer   Social Drivers of Health   Financial Resource Strain: Not on file  Food Insecurity: Patient Declined (05/12/2022)   Received from [facility], [facility]  Hunger Vital Sign    Worried About Running Out of Food in the Last Year: Patient declined    Ran Out of Food in the Last Year: Patient declined  Transportation Needs: No Transportation Needs (01/26/2022)   PRAPARE - Administrator, Civil Service (Medical): No    Lack of Transportation (Non-Medical): No  Physical Activity: Not on file  Stress: Not on file  Social Connections: Unknown (05/05/2022)   Received from [facility], [facility]   Social Network    Social Network: Not on file     Family History: The patient's family history includes Cancer in her father and maternal grandmother; Colon cancer in her father; Diabetes in her father, mother, and sister; Heart attack in her mother; Heart attack (age of onset: 41) in her father; Heart disease in her mother and  sister; Hypertension in her father, mother, and sister. There is no history of Breast cancer, Liver disease, Esophageal cancer, Rectal cancer, or Stomach cancer.  ROS:   Please see the history of present illness.     All other systems reviewed and are negative.  EKGs/Labs/Other Studies Reviewed:    The following studies were reviewed today: EKG Interpretation Date/Time:  Wednesday May 09 2024 13:49:08 EDT Ventricular Rate:  72 PR Interval:  136 QRS Duration:  80 QT Interval:  410 QTC Calculation: 448 R Axis:   -8  Text Interpretation: Normal sinus rhythm Minimal voltage criteria for LVH, may be normal variant ( R in aVL ) Confirmed by [place], Jemmie Ledgerwood [phone]) on 05/09/2024 1:52:06 PM   EKG Interpretation Date/Time:  Wednesday May 09 2024 13:49:08 EDT Ventricular Rate:  72 PR Interval:  136 QRS Duration:  80 QT Interval:  410 QTC Calculation: 448 R Axis:   -8  Text Interpretation: Normal sinus rhythm Minimal voltage criteria for LVH, may be normal variant ( R in aVL ) Confirmed by [place], Sweet Jarvis [id number]) on 05/09/2024 1:52:06 PM    Recent Labs: No results found for requested labs within last 365 days.  Recent Lipid Panel    Component Value Date/Time   CHOL 177 10/28/2022 1353   TRIG 48 10/28/2022 1353   HDL 78 10/28/2022 1353   CHOLHDL 2.3 10/28/2022 1353   CHOLHDL 1.9 Ratio 01/12/2010 2101   VLDL 10 01/12/2010 2101   LDLCALC 89 10/28/2022 1353     Risk Assessment/Calculations:                Physical Exam:    VS:  BP 138/84 (Cuff Size: Large)   Pulse 78   Ht 5\' 5"  (1.651 m)   Wt 271 lb (122.9 kg)   LMP  (LMP Unknown)   SpO2 99%   BMI 45.10 kg/m     Wt Readings from Last 3 Encounters:  05/09/24 271 lb (122.9 kg)  03/31/23 254 lb (115.2 kg)  03/09/23 254 lb (115.2 kg)     GEN:  Well nourished, overweight in no acute distress HEENT: Normal NECK: No JVD; No carotid bruits LYMPHATICS: No lymphadenopathy CARDIAC: RRR, no murmurs, rubs,  gallops RESPIRATORY:  Clear to auscultation without rales, wheezing or rhonchi  ABDOMEN: Soft, non-tender, non-distended MUSCULOSKELETAL:  No edema; No deformity  SKIN: Warm and dry NEUROLOGIC:  Alert and oriented x 3 PSYCHIATRIC:  Normal affect   ASSESSMENT:    1. Intermittent palpitations    PLAN:    In order of problems listed above:  Rare intermittent palpitations. Suspect PACs or PVCs. Risk is low. Since not having active  symptoms a monitor would be low yield. Recommend avoid caffeine. If palpitations get significantly worse then let us  know General CV exam. Normal exam. Ecg is OK. No active symptoms of chest pain or dyspnea. Recommend risk factor modification     Follow up PRN      Medication Adjustments/Labs and Tests Ordered: Current medicines are reviewed at length with the patient today.  Concerns regarding medicines are outlined above.  Orders Placed This Encounter  Procedures   EKG 12-Lead   No orders of the defined types were placed in this encounter.   There are no Patient Instructions on file for this visit.   Signed, Hubbert Landrigan [place], MD  05/09/2024 2:08 PM    [facility]

## 2024-05-09 ENCOUNTER — Ambulatory Visit: Attending: Cardiology | Admitting: Cardiology

## 2024-05-09 ENCOUNTER — Encounter: Payer: Self-pay | Admitting: Cardiology

## 2024-05-09 VITALS — BP 138/84 | HR 78 | Ht 65.0 in | Wt 271.0 lb

## 2024-05-09 DIAGNOSIS — R002 Palpitations: Secondary | ICD-10-CM | POA: Diagnosis not present

## 2024-05-09 NOTE — Patient Instructions (Signed)
 Medication Instructions:  Continue all medications  Lab Work: None ordered  Testing/Procedures: None ordered  Follow-Up: At [facility], you and your health needs are our priority.  As part of our continuing mission to provide you with exceptional heart care, our providers are all part of one team.  This team includes your primary Cardiologist (physician) and Advanced Practice Providers or APPs (Physician Assistants and Nurse Practitioners) who all work together to provide you with the care you need, when you need it.  Your next appointment:  As Needed    Provider:  Dr.Jordan   We recommend signing up for the patient portal called "MyChart".  Sign up information is provided on this After Visit Summary.  MyChart is used to connect with patients for Virtual Visits (Telemedicine).  Patients are able to view lab/test results, encounter notes, upcoming appointments, etc.  Non-urgent messages can be sent to your provider as well.   To learn more about what you can do with MyChart, go to [email or web address].

## 2024-05-17 ENCOUNTER — Ambulatory Visit
Admission: RE | Admit: 2024-05-17 | Discharge: 2024-05-17 | Disposition: A | Discharge status: Home / Self Care | Source: Ambulatory Visit | Attending: Family Medicine | Admitting: Family Medicine

## 2024-05-17 DIAGNOSIS — Z1231 Encounter for screening mammogram for malignant neoplasm of breast: Secondary | ICD-10-CM

## 2024-06-01 ENCOUNTER — Other Ambulatory Visit: Payer: Self-pay | Admitting: Gastroenterology

## 2024-06-27 ENCOUNTER — Other Ambulatory Visit: Payer: Self-pay | Admitting: Family

## 2024-06-27 DIAGNOSIS — N63 Unspecified lump in unspecified breast: Secondary | ICD-10-CM

## 2024-07-03 ENCOUNTER — Ambulatory Visit
Admission: RE | Admit: 2024-07-03 | Discharge: 2024-07-03 | Disposition: A | Discharge status: Home / Self Care | Source: Ambulatory Visit | Attending: Family | Admitting: Family

## 2024-07-03 DIAGNOSIS — N63 Unspecified lump in unspecified breast: Secondary | ICD-10-CM

## 2024-11-20 ENCOUNTER — Emergency Department (HOSPITAL_COMMUNITY)
Admission: EM | Admit: 2024-11-20 | Discharge: 2024-11-20 | Disposition: A | Discharge status: Home / Self Care | Attending: Emergency Medicine | Admitting: Emergency Medicine

## 2024-11-20 ENCOUNTER — Other Ambulatory Visit: Payer: Self-pay

## 2024-11-20 ENCOUNTER — Emergency Department (HOSPITAL_COMMUNITY)

## 2024-11-20 DIAGNOSIS — E1122 Type 2 diabetes mellitus with diabetic chronic kidney disease: Secondary | ICD-10-CM | POA: Insufficient documentation

## 2024-11-20 DIAGNOSIS — R079 Chest pain, unspecified: Secondary | ICD-10-CM | POA: Diagnosis present

## 2024-11-20 DIAGNOSIS — Z7984 Long term (current) use of oral hypoglycemic drugs: Secondary | ICD-10-CM | POA: Insufficient documentation

## 2024-11-20 DIAGNOSIS — N189 Chronic kidney disease, unspecified: Secondary | ICD-10-CM | POA: Insufficient documentation

## 2024-11-20 DIAGNOSIS — S20212A Contusion of left front wall of thorax, initial encounter: Secondary | ICD-10-CM | POA: Diagnosis not present

## 2024-11-20 DIAGNOSIS — I129 Hypertensive chronic kidney disease with stage 1 through stage 4 chronic kidney disease, or unspecified chronic kidney disease: Secondary | ICD-10-CM | POA: Insufficient documentation

## 2024-11-20 DIAGNOSIS — Y92410 Unspecified street and highway as the place of occurrence of the external cause: Secondary | ICD-10-CM | POA: Diagnosis not present

## 2024-11-20 DIAGNOSIS — K429 Umbilical hernia without obstruction or gangrene: Secondary | ICD-10-CM | POA: Diagnosis not present

## 2024-11-20 LAB — CBC
HCT: 42.3 % (ref 36.0–46.0)
Hemoglobin: 13.1 g/dL (ref 12.0–15.0)
MCH: 23.4 pg — ABNORMAL LOW (ref 26.0–34.0)
MCHC: 31 g/dL (ref 30.0–36.0)
MCV: 75.7 fL — ABNORMAL LOW (ref 80.0–100.0)
Platelets: 295 K/uL (ref 150–400)
RBC: 5.59 MIL/uL — ABNORMAL HIGH (ref 3.87–5.11)
RDW: 15.2 % (ref 11.5–15.5)
WBC: 8.6 K/uL (ref 4.0–10.5)
nRBC: 0 % (ref 0.0–0.2)

## 2024-11-20 LAB — TROPONIN I (HIGH SENSITIVITY): Troponin I (High Sensitivity): 5 ng/L (ref <18)

## 2024-11-20 LAB — COMPREHENSIVE METABOLIC PANEL WITH GFR
ALT: 16 U/L (ref 0–44)
AST: 21 U/L (ref 15–41)
Albumin: 3.9 g/dL (ref 3.5–5.0)
Alkaline Phosphatase: 83 U/L (ref 38–126)
Anion gap: 13 (ref 5–15)
BUN: 10 mg/dL (ref 6–20)
CO2: 18 mmol/L — ABNORMAL LOW (ref 22–32)
Calcium: 9.2 mg/dL (ref 8.9–10.3)
Chloride: 106 mmol/L (ref 98–111)
Creatinine, Ser: 0.77 mg/dL (ref 0.44–1.00)
GFR, Estimated: >60 mL/min (ref >60)
Glucose, Bld: 99 mg/dL (ref 70–99)
Potassium: 4.2 mmol/L (ref 3.5–5.1)
Sodium: 137 mmol/L (ref 135–145)
Total Bilirubin: 0.2 mg/dL (ref 0.0–1.2)
Total Protein: 8.1 g/dL (ref 6.5–8.1)

## 2024-11-20 LAB — I-STAT CHEM 8, ED
BUN: 10 mg/dL (ref 6–20)
Calcium, Ion: 1 mmol/L — ABNORMAL LOW (ref 1.15–1.40)
Chloride: 106 mmol/L (ref 98–111)
Creatinine, Ser: 0.8 mg/dL (ref 0.44–1.00)
Glucose, Bld: 103 mg/dL — ABNORMAL HIGH (ref 70–99)
HCT: 43 % (ref 36.0–46.0)
Hemoglobin: 14.6 g/dL (ref 12.0–15.0)
Potassium: 4 mmol/L (ref 3.5–5.1)
Sodium: 139 mmol/L (ref 135–145)
TCO2: 24 mmol/L (ref 22–32)

## 2024-11-20 MED ORDER — IOHEXOL 350 MG/ML SOLN
100.0000 mL | Freq: Once | INTRAVENOUS | Status: AC | PRN
  Administered 2024-11-20: 100 mL via INTRAVENOUS

## 2024-11-20 MED ORDER — METHOCARBAMOL 500 MG PO TABS: 500.0000 mg | ORAL_TABLET | Freq: Two times a day (BID) | ORAL | 0 refills | Status: AC

## 2024-11-20 MED ORDER — LIDOCAINE 5 % EX PTCH
1.0000 | MEDICATED_PATCH | Freq: Once | CUTANEOUS | Status: AC
  Administered 2024-11-20: 1 via TRANSDERMAL
  Filled 2024-11-20: qty 1

## 2024-11-20 MED ORDER — LIDOCAINE 5 % EX PTCH: 1.0000 | MEDICATED_PATCH | CUTANEOUS | 0 refills | Status: AC

## 2024-11-20 MED ORDER — OXYCODONE-ACETAMINOPHEN 5-325 MG PO TABS
1.0000 | ORAL_TABLET | Freq: Once | ORAL | Status: AC
  Administered 2024-11-20: 1 via ORAL
  Filled 2024-11-20: qty 1

## 2024-11-20 MED ORDER — OXYCODONE-ACETAMINOPHEN 5-325 MG PO TABS: 1.0000 | ORAL_TABLET | Freq: Four times a day (QID) | ORAL | 0 refills | Status: AC | PRN
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
## 2025-01-11 ENCOUNTER — Telehealth (INDEPENDENT_AMBULATORY_CARE_PROVIDER_SITE_OTHER): Payer: Self-pay | Admitting: Primary Care

## 2025-01-11 NOTE — Telephone Encounter (Signed)
 Called pt to make aware that appt will be at [facility]. On January 27th at 1050. Please advise.

## 2025-01-18 ENCOUNTER — Telehealth: Payer: Self-pay | Admitting: Primary Care

## 2025-01-18 NOTE — Telephone Encounter (Signed)
 Spoke to pt about upcoming appt.. Will be present

## 2025-01-22 ENCOUNTER — Encounter (INDEPENDENT_AMBULATORY_CARE_PROVIDER_SITE_OTHER): Payer: Self-pay | Admitting: Primary Care
